# Patient Record
Sex: Female | Born: 1970 | Race: White | Hispanic: No | Marital: Single | Smoking: Former smoker
Health system: Southern US, Community
[De-identification: ages and names within clinical notes are randomized; demographics above are authoritative.]

## PROBLEM LIST (undated history)

## (undated) DIAGNOSIS — M1990 Unspecified osteoarthritis, unspecified site: Secondary | ICD-10-CM

## (undated) DIAGNOSIS — F419 Anxiety disorder, unspecified: Secondary | ICD-10-CM

## (undated) DIAGNOSIS — E282 Polycystic ovarian syndrome: Secondary | ICD-10-CM

## (undated) DIAGNOSIS — R51 Headache: Secondary | ICD-10-CM

## (undated) DIAGNOSIS — R519 Headache, unspecified: Secondary | ICD-10-CM

## (undated) DIAGNOSIS — F32A Depression, unspecified: Secondary | ICD-10-CM

## (undated) DIAGNOSIS — Z8709 Personal history of other diseases of the respiratory system: Secondary | ICD-10-CM

## (undated) DIAGNOSIS — J45909 Unspecified asthma, uncomplicated: Secondary | ICD-10-CM

## (undated) DIAGNOSIS — F329 Major depressive disorder, single episode, unspecified: Secondary | ICD-10-CM

## (undated) DIAGNOSIS — J189 Pneumonia, unspecified organism: Secondary | ICD-10-CM

## (undated) DIAGNOSIS — E079 Disorder of thyroid, unspecified: Secondary | ICD-10-CM

## (undated) DIAGNOSIS — G2581 Restless legs syndrome: Secondary | ICD-10-CM

## (undated) DIAGNOSIS — E039 Hypothyroidism, unspecified: Secondary | ICD-10-CM

## (undated) DIAGNOSIS — Z8701 Personal history of pneumonia (recurrent): Secondary | ICD-10-CM

## (undated) HISTORY — PX: TONSILLECTOMY: SUR1361

## (undated) HISTORY — DX: Pneumonia, unspecified organism: J18.9

## (undated) HISTORY — DX: Restless legs syndrome: G25.81

## (undated) HISTORY — PX: CHOLECYSTECTOMY: SHX55

---

## 2014-10-02 ENCOUNTER — Emergency Department (HOSPITAL_COMMUNITY)
Admission: EM | Admit: 2014-10-02 | Discharge: 2014-10-02 | Disposition: A | Discharge status: Home / Self Care | Payer: 59 | Attending: Emergency Medicine | Admitting: Emergency Medicine

## 2014-10-02 ENCOUNTER — Emergency Department (HOSPITAL_COMMUNITY): Payer: 59

## 2014-10-02 ENCOUNTER — Encounter (HOSPITAL_COMMUNITY): Payer: Self-pay | Admitting: Emergency Medicine

## 2014-10-02 DIAGNOSIS — R1310 Dysphagia, unspecified: Secondary | ICD-10-CM | POA: Diagnosis not present

## 2014-10-02 DIAGNOSIS — J45909 Unspecified asthma, uncomplicated: Secondary | ICD-10-CM | POA: Diagnosis not present

## 2014-10-02 DIAGNOSIS — E669 Obesity, unspecified: Secondary | ICD-10-CM | POA: Diagnosis not present

## 2014-10-02 DIAGNOSIS — J988 Other specified respiratory disorders: Secondary | ICD-10-CM | POA: Diagnosis present

## 2014-10-02 DIAGNOSIS — Z79899 Other long term (current) drug therapy: Secondary | ICD-10-CM | POA: Diagnosis not present

## 2014-10-02 DIAGNOSIS — E079 Disorder of thyroid, unspecified: Secondary | ICD-10-CM | POA: Insufficient documentation

## 2014-10-02 HISTORY — DX: Disorder of thyroid, unspecified: E07.9

## 2014-10-02 HISTORY — DX: Polycystic ovarian syndrome: E28.2

## 2014-10-02 HISTORY — DX: Major depressive disorder, single episode, unspecified: F32.9

## 2014-10-02 HISTORY — DX: Depression, unspecified: F32.A

## 2014-10-02 HISTORY — DX: Unspecified asthma, uncomplicated: J45.909

## 2014-10-02 NOTE — Discharge Instructions (Signed)
Follow-up ENT doctor. Call office in the morning for appointment Thursday or Friday.

## 2014-10-02 NOTE — ED Notes (Signed)
Pt states it feels like something is stuck in her throat. She is able to breath, talk and swallow normal. Pt states she has felt like this x 2 days.

## 2014-10-02 NOTE — ED Provider Notes (Signed)
CSN: [id number]     Arrival date & time 10/02/14  1906 History  This chart was scribed for Nat Christen, MD by Julien Nordmann, ED Scribe. This patient was seen in room APA14/APA14 and the patient's care was started at 9:43 PM.    Chief Complaint  Patient presents with  . Airway Obstruction      The history is provided by the patient. No language interpreter was used.   HPI Comments: Connie Simpson is a 44 y.o. female who presents to the Emergency Department complaining of airway obstruction onset 3 days ago. Pt states she feels like something is stuck in her throat. Pt has been eating normal. Per mother, pt has severe coughing last week. She reports being discharged from the hospital on 7/1 for pneumonia. Pt is unsure of what caused that feeling in her throat. Pt denies loss of appetite, trouble swallowing, and difficulty breathing.  Past Medical History  Diagnosis Date  . Thyroid disease   . Asthma   . Depression   . PCOS (polycystic ovarian syndrome)    Past Surgical History  Procedure Laterality Date  . Tonsillectomy    . Cesarean section     History reviewed. No pertinent family history. History  Substance Use Topics  . Smoking status: Never Smoker   . Smokeless tobacco: Not on file  . Alcohol Use: No   OB History    No data available     Review of Systems  A complete 10 system review of systems was obtained and all systems are negative except as noted in the HPI and PMH.    Allergies  Erythromycin  Home Medications   Prior to Admission medications   Medication Sig Start Date End Date Taking? Authorizing Provider  albuterol (PROVENTIL) (2.5 MG/3ML) 0.083% nebulizer solution Take 2.5 mg by nebulization every 6 (six) hours as needed for wheezing.  09/24/14  Yes Historical Provider, MD  ALPRAZolam Duanne Moron) 0.5 MG tablet Take 0.5 mg by mouth 2 (two) times daily as needed for anxiety.  08/19/14  Yes Historical Provider, MD  cyclobenzaprine (FLEXERIL) 10 MG tablet Take 10  mg by mouth at bedtime. 07/19/14  Yes Historical Provider, MD  DULERA 200-5 MCG/ACT AERO Inhale 2 puffs into the lungs 2 (two) times daily. 09/24/14  Yes Historical Provider, MD  levothyroxine (SYNTHROID, LEVOTHROID) 100 MCG tablet Take 100 mcg by mouth daily. 09/16/14  Yes Historical Provider, MD  metFORMIN (GLUCOPHAGE-XR) 500 MG 24 hr tablet Take 500 mg by mouth 2 (two) times daily. 07/19/14  Yes Historical Provider, MD  montelukast (SINGULAIR) 10 MG tablet Take 10 mg by mouth every morning. 07/19/14  Yes Historical Provider, MD  pantoprazole (PROTONIX) 20 MG tablet Take 20 mg by mouth 2 (two) times daily. 07/19/14  Yes Historical Provider, MD  sertraline (ZOLOFT) 100 MG tablet Take 100 mg by mouth daily. 07/19/14  Yes Historical Provider, MD  VENTOLIN HFA 108 (90 BASE) MCG/ACT inhaler INHALE TWO PUFFS EVERY 4 TO 6 HOURS AS NEEDED FOR COUGH OR WHEEZE 09/16/14  Yes Historical Provider, MD  azithromycin (ZITHROMAX) 250 MG tablet TAKE TWO (2) TABLETS ON FIRST DAY, THEN TAKE ONE (1) TABLET EACH DAY FOR THE NEXT 4 DAYS 09/23/14   Historical Provider, MD  levofloxacin (LEVAQUIN) 500 MG tablet Take 500 mg by mouth daily. 5 day course completed on 10/01/14 09/27/14   Historical Provider, MD  predniSONE (DELTASONE) 20 MG tablet TAKE TWO (2) TABLETS BY MOUTH DAILY FOR 5 DAYS. 09/27/14   Historical Provider, MD  Triage vitals: BP 164/93 mmHg  Pulse 60  Temp(Src) 98.3 F (36.8 C) (Oral)  Resp 17  Ht 5' (1.524 m)  Wt 355 lb (161.027 kg)  BMI 69.33 kg/m2  SpO2 98%  LMP 09/24/2014  Physical Exam  Constitutional: She is oriented to person, place, and time.  obese  HENT:  Head: Normocephalic and atraumatic.  Eyes: Conjunctivae and EOM are normal. Pupils are equal, round, and reactive to light.  Neck: Normal range of motion. Neck supple.  Cardiovascular: Normal rate and regular rhythm.   Pulmonary/Chest: Effort normal and breath sounds normal.  Abdominal: Soft. Bowel sounds are normal.  Musculoskeletal: Normal  range of motion.  Neurological: She is alert and oriented to person, place, and time.  Skin: Skin is warm and dry.  Psychiatric: She has a normal mood and affect. Her behavior is normal.  Nursing note and vitals reviewed.   ED Course  Procedures  DIAGNOSTIC STUDIES: Oxygen Saturation is 98% on RA, normal by my interpretation.  COORDINATION OF CARE:  9:46 PM Discussed treatment plan which includes follow up with ENT doctor with pt at bedside and pt agreed to plan.  Labs Review Labs Reviewed - No data to display  Imaging Review Dg Chest 2 View  10/02/2014   CLINICAL DATA:  Chest discomfort and sore throat. Sensation of something being stuck in throat. Asthma.  EXAM: CHEST  2 VIEW  COMPARISON:  None.  FINDINGS: The heart size and mediastinal contours are within normal limits. Both lungs are clear. No evidence of pleural effusion or pneumothorax. Mild pulmonary hyperinflation demonstrated. Mild lower thoracic spine degenerative disc disease and kyphosis noted. No radiopaque foreign body identified.  IMPRESSION: No active cardiopulmonary disease.   Electronically Signed   By: Earle Gell M.D.   On: 10/02/2014 19:56     EKG Interpretation None      MDM   Final diagnoses:  Dysphagia   Patient is able to swallow. No respiratory distress. Chest x-ray negative. Discussed with Dr. Erik Obey.  Patient will follow-up as an outpatient in the office.  I personally performed the services described in this documentation, which was scribed in my presence. The recorded information has been reviewed and is accurate.   Nat Christen, MD 10/02/14 [phone]

## 2015-05-01 ENCOUNTER — Ambulatory Visit (INDEPENDENT_AMBULATORY_CARE_PROVIDER_SITE_OTHER): Payer: 59 | Admitting: Otolaryngology

## 2015-05-01 DIAGNOSIS — J330 Polyp of nasal cavity: Secondary | ICD-10-CM

## 2015-05-01 DIAGNOSIS — J310 Chronic rhinitis: Secondary | ICD-10-CM | POA: Diagnosis not present

## 2015-05-01 DIAGNOSIS — J343 Hypertrophy of nasal turbinates: Secondary | ICD-10-CM | POA: Diagnosis not present

## 2015-05-01 DIAGNOSIS — J342 Deviated nasal septum: Secondary | ICD-10-CM

## 2015-05-02 ENCOUNTER — Other Ambulatory Visit (INDEPENDENT_AMBULATORY_CARE_PROVIDER_SITE_OTHER): Payer: Self-pay | Admitting: Otolaryngology

## 2015-05-02 DIAGNOSIS — J320 Chronic maxillary sinusitis: Secondary | ICD-10-CM

## 2015-05-06 ENCOUNTER — Ambulatory Visit (HOSPITAL_COMMUNITY)
Admission: RE | Admit: 2015-05-06 | Discharge: 2015-05-06 | Disposition: A | Discharge status: Home / Self Care | Payer: 59 | Source: Ambulatory Visit | Attending: Otolaryngology | Admitting: Otolaryngology

## 2015-05-06 DIAGNOSIS — R51 Headache: Secondary | ICD-10-CM | POA: Diagnosis not present

## 2015-05-06 DIAGNOSIS — J320 Chronic maxillary sinusitis: Secondary | ICD-10-CM | POA: Diagnosis not present

## 2015-05-06 DIAGNOSIS — J323 Chronic sphenoidal sinusitis: Secondary | ICD-10-CM | POA: Insufficient documentation

## 2015-05-08 ENCOUNTER — Ambulatory Visit (INDEPENDENT_AMBULATORY_CARE_PROVIDER_SITE_OTHER): Payer: 59 | Admitting: Otolaryngology

## 2015-05-08 DIAGNOSIS — J323 Chronic sphenoidal sinusitis: Secondary | ICD-10-CM | POA: Diagnosis not present

## 2015-05-08 DIAGNOSIS — J320 Chronic maxillary sinusitis: Secondary | ICD-10-CM

## 2015-05-08 DIAGNOSIS — J322 Chronic ethmoidal sinusitis: Secondary | ICD-10-CM

## 2015-05-08 DIAGNOSIS — J330 Polyp of nasal cavity: Secondary | ICD-10-CM

## 2015-05-13 ENCOUNTER — Other Ambulatory Visit: Payer: Self-pay | Admitting: Otolaryngology

## 2015-08-15 ENCOUNTER — Other Ambulatory Visit: Payer: Self-pay | Admitting: Otolaryngology

## 2015-08-18 NOTE — Pre-Procedure Instructions (Signed)
Connie Simpson  08/18/2015      [facility] - [place], [place], [place] - [place] [address] Phone: [phone] Fax: [phone]    Your procedure is scheduled on Aug 27, 2015  Report to [facility] Admitting at 06:30 A.M.  Call this number if you have problems the morning of surgery:  [phone]   Remember:  Do not eat food or drink liquids after midnight.   Take these medicines the morning of surgery with A SIP OF WATER  Tylenol if needed, Advair, albuterol if needed, alprazolam (Xanax), zyrtec, levothyroxine (Synthroid), Montelukast (Singulair), sertaline (zoloft), ventolin inhaler if needed  Please bring all inhalers with you on day of surgery  WHAT DO I DO ABOUT MY DIABETES MEDICATION?  Do not take Metformin diabetes medicines (pills) the morning of surgery.  Stop taking Aspirin, ibuprofen, aleve, naproxen, Advil, Motrin, Goody's, BC's, all herbal medications, all vitamins, and fish oil    How to Manage Your Diabetes Before and After Surgery  Why is it important to control my blood sugar before and after surgery? . Improving blood sugar levels before and after surgery helps healing and can limit problems. . A way of improving blood sugar control is eating a healthy diet by: o  Eating less sugar and carbohydrates o  Increasing activity/exercise o  Talking with your doctor about reaching your blood sugar goals . High blood sugars (greater than 180 mg/dL) can raise your risk of infections and slow your recovery, so you will need to focus on controlling your diabetes during the weeks before surgery. . Make sure that the doctor who takes care of your diabetes knows about your planned surgery including the date and location.  How do I manage my blood sugar before surgery? . Check your blood sugar at least 4 times a day, starting 2 days before surgery, to make sure that the level is not too high or low. o Check your blood  sugar the morning of your surgery when you wake up and every 2 hours until you get to the Short Stay unit. . If your blood sugar is less than 70 mg/dL, you will need to treat for low blood sugar: o Do not take insulin. o Treat a low blood sugar (less than 70 mg/dL) with  cup of clear juice (cranberry or apple), 4 glucose tablets, OR glucose gel. o Recheck blood sugar in 15 minutes after treatment (to make sure it is greater than 70 mg/dL). If your blood sugar is not greater than 70 mg/dL on recheck, call [phone] for further instructions. . Report your blood sugar to the short stay nurse when you get to Short Stay.  . If you are admitted to the hospital after surgery: o Your blood sugar will be checked by the staff and you will probably be given insulin after surgery (instead of oral diabetes medicines) to make sure you have good blood sugar levels. o The goal for blood sugar control after surgery is 80-180 mg/dL.  .    Do not wear jewelry, make-up or nail polish.  Do not wear lotions, powders, or perfumes.  You may wear deodorant.  Do not shave 48 hours prior to surgery.  Men may shave face and neck.  Do not bring valuables to the hospital.  [facility] is not responsible for any belongings or valuables.  Contacts, dentures or bridgework may not be worn into surgery.  Leave your suitcase in  the car.  After surgery it may be brought to your room.  For patients admitted to the hospital, discharge time will be determined by your treatment team.  Patients discharged the day of surgery will not be allowed to drive home.   Special instructions:  [place] - Preparing for Surgery  Before surgery, you can play an important role.  Because skin is not sterile, your skin needs to be as free of germs as possible.  You can reduce the number of germs on you skin by washing with CHG (chlorahexidine gluconate) soap before surgery.  CHG is an antiseptic cleaner which kills germs and bonds with the  skin to continue killing germs even after washing.  Please DO NOT use if you have an allergy to CHG or antibacterial soaps.  If your skin becomes reddened/irritated stop using the CHG and inform your nurse when you arrive at Short Stay.  Do not shave (including legs and underarms) for at least 48 hours prior to the first CHG shower.  You may shave your face.  Please follow these instructions carefully:   1.  Shower with CHG Soap the night before surgery and the  morning of Surgery.  2.  If you choose to wash your hair, wash your hair first as usual with your normal shampoo.  3.  After you shampoo, rinse your hair and body thoroughly to remove the Shampoo.  4.  Use CHG as you would any other liquid soap.  You can apply chg directly to the skin and wash gently with scrungie or a clean washcloth.  5.  Apply the CHG Soap to your body ONLY FROM THE NECK DOWN.  Do not use on open wounds or open sores.  Avoid contact with your eyes ears, mouth and genitals (private parts).  Wash genitals (private parts) with your normal soap.  6.  Wash thoroughly, paying special attention to the area where your surgery will be performed.  7.  Thoroughly rinse your body with warm water from the neck down.  8.  DO NOT shower/wash with your normal soap after using and rinsing off the CHG Soap.  9.  Pat yourself dry with a clean towel.            10.  Wear clean pajamas.            11.  Place clean sheets on your bed the night of your first shower and do not sleep with pets.  Day of Surgery  Do not apply any lotions/deoderants the morning of surgery.  Please wear clean clothes to the hospital/surgery center.    Please read over the following fact sheets that you were given. Pain Booklet, Coughing and Deep Breathing and Surgical Site Infection Prevention    .

## 2015-08-19 ENCOUNTER — Encounter (HOSPITAL_COMMUNITY)
Admission: RE | Admit: 2015-08-19 | Discharge: 2015-08-19 | Disposition: A | Discharge status: Home / Self Care | Payer: 59 | Source: Ambulatory Visit | Attending: Otolaryngology | Admitting: Otolaryngology

## 2015-08-19 ENCOUNTER — Encounter (HOSPITAL_COMMUNITY): Payer: Self-pay

## 2015-08-19 DIAGNOSIS — J45909 Unspecified asthma, uncomplicated: Secondary | ICD-10-CM | POA: Diagnosis not present

## 2015-08-19 DIAGNOSIS — J343 Hypertrophy of nasal turbinates: Secondary | ICD-10-CM | POA: Diagnosis not present

## 2015-08-19 DIAGNOSIS — J342 Deviated nasal septum: Secondary | ICD-10-CM | POA: Insufficient documentation

## 2015-08-19 DIAGNOSIS — E039 Hypothyroidism, unspecified: Secondary | ICD-10-CM | POA: Diagnosis not present

## 2015-08-19 DIAGNOSIS — J328 Other chronic sinusitis: Secondary | ICD-10-CM | POA: Diagnosis not present

## 2015-08-19 DIAGNOSIS — Z7984 Long term (current) use of oral hypoglycemic drugs: Secondary | ICD-10-CM | POA: Insufficient documentation

## 2015-08-19 DIAGNOSIS — E282 Polycystic ovarian syndrome: Secondary | ICD-10-CM | POA: Insufficient documentation

## 2015-08-19 DIAGNOSIS — Z01818 Encounter for other preprocedural examination: Secondary | ICD-10-CM | POA: Insufficient documentation

## 2015-08-19 DIAGNOSIS — Z01812 Encounter for preprocedural laboratory examination: Secondary | ICD-10-CM | POA: Insufficient documentation

## 2015-08-19 DIAGNOSIS — Z79899 Other long term (current) drug therapy: Secondary | ICD-10-CM | POA: Insufficient documentation

## 2015-08-19 HISTORY — DX: Personal history of pneumonia (recurrent): Z87.01

## 2015-08-19 HISTORY — DX: Headache, unspecified: R51.9

## 2015-08-19 HISTORY — DX: Unspecified osteoarthritis, unspecified site: M19.90

## 2015-08-19 HISTORY — DX: Hypothyroidism, unspecified: E03.9

## 2015-08-19 HISTORY — DX: Headache: R51

## 2015-08-19 HISTORY — DX: Personal history of other diseases of the respiratory system: Z87.09

## 2015-08-19 HISTORY — DX: Anxiety disorder, unspecified: F41.9

## 2015-08-19 LAB — CBC
HEMATOCRIT: 43.1 % (ref 36.0–46.0)
HEMOGLOBIN: 13.2 g/dL (ref 12.0–15.0)
MCH: 25.8 pg — ABNORMAL LOW (ref 26.0–34.0)
MCHC: 30.6 g/dL (ref 30.0–36.0)
MCV: 84.3 fL (ref 78.0–100.0)
Platelets: 296 10*3/uL (ref 150–400)
RBC: 5.11 MIL/uL (ref 3.87–5.11)
RDW: 13.8 % (ref 11.5–15.5)
WBC: 8.4 10*3/uL (ref 4.0–10.5)

## 2015-08-19 LAB — HCG, SERUM, QUALITATIVE: Preg, Serum: NEGATIVE

## 2015-08-19 NOTE — Progress Notes (Signed)
PCP- Rory Percy, MD Cardiology - denies  EKG: requested from [facility] test - denies ECHO - denies Cardiac Cath - denies  Patient denies shortness of breath and chest pain at PAT appointment  Patient takes Metformin for PCOS

## 2015-08-20 NOTE — Progress Notes (Signed)
Anesthesia Chart Review:  Pt is a 45 year old female scheduled for sinus endo with fusion, endoscopic total B ethmoidectomy, endoscopic B maxillary antrostomy, endoscopic L sphenoidectomy, nasal septoplasty with turbinate reduction on 08/27/2015 with Dr. Benjamine Mola.   PMH includes:  Asthma, hypothyroidism, PCOS. Never smoker. BMI 59  Medications include: advair, albuterol, levothyroxine, metformin  Preoperative labs reviewed.    Chest x-ray 10/02/14 reviewed. No active cardiopulmonary disease.   EKG 01/25/15: sinus rhythm. Low voltage precordial leads. Consider anterior infarct. Baseline wander in V1, V2.   Reviewed case with Dr. Royce Macadamia.   If no changes, I anticipate pt can proceed with surgery as scheduled.   Willeen Cass, FNP-BC [facility]/Anesthesiology Phone: [phone] 08/20/2015 3:34 PM

## 2015-08-26 ENCOUNTER — Encounter (HOSPITAL_BASED_OUTPATIENT_CLINIC_OR_DEPARTMENT_OTHER): Payer: Self-pay | Admitting: Certified Registered Nurse Anesthetist

## 2015-08-26 DIAGNOSIS — F419 Anxiety disorder, unspecified: Secondary | ICD-10-CM | POA: Diagnosis present

## 2015-08-26 DIAGNOSIS — J342 Deviated nasal septum: Secondary | ICD-10-CM | POA: Diagnosis present

## 2015-08-26 DIAGNOSIS — I10 Essential (primary) hypertension: Secondary | ICD-10-CM | POA: Diagnosis present

## 2015-08-26 DIAGNOSIS — K219 Gastro-esophageal reflux disease without esophagitis: Secondary | ICD-10-CM | POA: Diagnosis present

## 2015-08-26 DIAGNOSIS — J343 Hypertrophy of nasal turbinates: Secondary | ICD-10-CM | POA: Diagnosis present

## 2015-08-26 DIAGNOSIS — M1990 Unspecified osteoarthritis, unspecified site: Secondary | ICD-10-CM | POA: Diagnosis present

## 2015-08-26 DIAGNOSIS — F329 Major depressive disorder, single episode, unspecified: Secondary | ICD-10-CM | POA: Diagnosis present

## 2015-08-26 DIAGNOSIS — F32A Depression, unspecified: Secondary | ICD-10-CM | POA: Diagnosis present

## 2015-08-26 DIAGNOSIS — J45909 Unspecified asthma, uncomplicated: Secondary | ICD-10-CM | POA: Diagnosis present

## 2015-08-26 NOTE — Anesthesia Preprocedure Evaluation (Addendum)
Anesthesia Evaluation   Patient awake    Reviewed: Allergy & Precautions, H&P , NPO status , Patient's Chart, lab work & pertinent test results  History of Anesthesia Complications Negative for: history of anesthetic complications  Airway Mallampati: III  TM Distance: >3 FB Neck ROM: Full    Dental  (+) Teeth Intact, Dental Advisory Given   Pulmonary shortness of breath and with exertion, asthma ,    breath sounds clear to auscultation       Cardiovascular Exercise Tolerance: Poor  Rhythm:Regular Rate:Normal     Neuro/Psych  Headaches, PSYCHIATRIC DISORDERS Anxiety Depression    GI/Hepatic GERD  Medicated and Controlled,  Endo/Other  diabetes, Oral Hypoglycemic AgentsHypothyroidism Morbid obesity  Renal/GU      Musculoskeletal  (+) Arthritis , Osteoarthritis,    Abdominal   Peds  Hematology   Anesthesia Other Findings   Reproductive/Obstetrics                            BP Readings from Last 3 Encounters:  08/19/15 157/89  10/02/14 136/94   Lab Results  Component Value Date   WBC 8.4 08/19/2015   HGB 13.2 08/19/2015   HCT 43.1 08/19/2015   MCV 84.3 08/19/2015   PLT 296 08/19/2015     Chemistry   No results found for: NA, K, CL, CO2, BUN, CREATININE, GLU No results found for: CALCIUM, ALKPHOS, AST, ALT, BILITOT   No results found for: HGBA1C  Anesthesia Physical Anesthesia Plan  ASA: III  Anesthesia Plan: General   Post-op Pain Management:    Induction: Intravenous  Airway Management Planned: Oral ETT  Additional Equipment:   Intra-op Plan:   Post-operative Plan: Extubation in OR  Informed Consent: I have reviewed the patients History and Physical, chart, labs and discussed the procedure including the risks, benefits and alternatives for the proposed anesthesia with the patient or authorized representative who has indicated his/her understanding and acceptance.    Dental advisory given  Plan Discussed with: CRNA  Anesthesia Plan Comments:         Anesthesia Quick Evaluation

## 2015-08-27 ENCOUNTER — Ambulatory Visit (HOSPITAL_COMMUNITY): Payer: 59 | Admitting: Emergency Medicine

## 2015-08-27 ENCOUNTER — Ambulatory Visit (HOSPITAL_BASED_OUTPATIENT_CLINIC_OR_DEPARTMENT_OTHER)
Admission: RE | Admit: 2015-08-27 | Discharge: 2015-08-27 | Disposition: A | Discharge status: Home / Self Care | Payer: 59 | Source: Ambulatory Visit | Attending: Otolaryngology | Admitting: Otolaryngology

## 2015-08-27 ENCOUNTER — Encounter (HOSPITAL_COMMUNITY)
Admission: RE | Disposition: A | Discharge status: Home / Self Care | Payer: Self-pay | Source: Ambulatory Visit | Attending: Otolaryngology

## 2015-08-27 ENCOUNTER — Ambulatory Visit (HOSPITAL_COMMUNITY): Payer: 59 | Admitting: Certified Registered Nurse Anesthetist

## 2015-08-27 DIAGNOSIS — E119 Type 2 diabetes mellitus without complications: Secondary | ICD-10-CM | POA: Insufficient documentation

## 2015-08-27 DIAGNOSIS — E039 Hypothyroidism, unspecified: Secondary | ICD-10-CM | POA: Insufficient documentation

## 2015-08-27 DIAGNOSIS — Z6841 Body Mass Index (BMI) 40.0 and over, adult: Secondary | ICD-10-CM | POA: Insufficient documentation

## 2015-08-27 DIAGNOSIS — J320 Chronic maxillary sinusitis: Secondary | ICD-10-CM | POA: Insufficient documentation

## 2015-08-27 DIAGNOSIS — J343 Hypertrophy of nasal turbinates: Secondary | ICD-10-CM | POA: Diagnosis present

## 2015-08-27 DIAGNOSIS — K219 Gastro-esophageal reflux disease without esophagitis: Secondary | ICD-10-CM | POA: Diagnosis not present

## 2015-08-27 DIAGNOSIS — J3489 Other specified disorders of nose and nasal sinuses: Secondary | ICD-10-CM | POA: Insufficient documentation

## 2015-08-27 DIAGNOSIS — F419 Anxiety disorder, unspecified: Secondary | ICD-10-CM | POA: Diagnosis not present

## 2015-08-27 DIAGNOSIS — Z7984 Long term (current) use of oral hypoglycemic drugs: Secondary | ICD-10-CM | POA: Insufficient documentation

## 2015-08-27 DIAGNOSIS — J342 Deviated nasal septum: Secondary | ICD-10-CM | POA: Diagnosis present

## 2015-08-27 DIAGNOSIS — J338 Other polyp of sinus: Secondary | ICD-10-CM | POA: Insufficient documentation

## 2015-08-27 DIAGNOSIS — F329 Major depressive disorder, single episode, unspecified: Secondary | ICD-10-CM | POA: Insufficient documentation

## 2015-08-27 DIAGNOSIS — J45909 Unspecified asthma, uncomplicated: Secondary | ICD-10-CM | POA: Diagnosis not present

## 2015-08-27 DIAGNOSIS — M1990 Unspecified osteoarthritis, unspecified site: Secondary | ICD-10-CM | POA: Diagnosis present

## 2015-08-27 DIAGNOSIS — J322 Chronic ethmoidal sinusitis: Secondary | ICD-10-CM | POA: Insufficient documentation

## 2015-08-27 DIAGNOSIS — F32A Depression, unspecified: Secondary | ICD-10-CM | POA: Diagnosis present

## 2015-08-27 DIAGNOSIS — J323 Chronic sphenoidal sinusitis: Secondary | ICD-10-CM | POA: Diagnosis not present

## 2015-08-27 DIAGNOSIS — I10 Essential (primary) hypertension: Secondary | ICD-10-CM | POA: Diagnosis present

## 2015-08-27 HISTORY — PX: NASAL SEPTOPLASTY W/ TURBINOPLASTY: SHX2070

## 2015-08-27 HISTORY — PX: SINUS ENDO WITH FUSION: SHX5329

## 2015-08-27 HISTORY — PX: MAXILLARY ANTROSTOMY: SHX2003

## 2015-08-27 HISTORY — PX: SPHENOIDECTOMY: SHX2421

## 2015-08-27 HISTORY — PX: ETHMOIDECTOMY: SHX5197

## 2015-08-27 SURGERY — SURGERY, PARANASAL SINUS, ENDOSCOPIC, WITH NASAL SEPTOPLASTY, TURBINOPLASTY, AND MAXILLARY SINUSOTOMY
Anesthesia: General | Site: Nose

## 2015-08-27 MED ORDER — KETAMINE HCL 100 MG/ML IJ SOLN
INTRAMUSCULAR | Status: DC | PRN
  Administered 2015-08-27: 40 mg via INTRAVENOUS
  Administered 2015-08-27: 20 mg via INTRAVENOUS
  Administered 2015-08-27: 80 mg via INTRAVENOUS
  Administered 2015-08-27: 20 mg via INTRAVENOUS

## 2015-08-27 MED ORDER — ROCURONIUM BROMIDE 50 MG/5ML IV SOLN
INTRAVENOUS | Status: AC
  Filled 2015-08-27: qty 1

## 2015-08-27 MED ORDER — DEXAMETHASONE SODIUM PHOSPHATE 10 MG/ML IJ SOLN
INTRAMUSCULAR | Status: DC | PRN
  Administered 2015-08-27: 10 mg via INTRAVENOUS

## 2015-08-27 MED ORDER — PROPOFOL 10 MG/ML IV BOLUS
INTRAVENOUS | Status: DC | PRN
  Administered 2015-08-27: 50 mg via INTRAVENOUS
  Administered 2015-08-27: 300 mg via INTRAVENOUS

## 2015-08-27 MED ORDER — BACITRACIN-NEOMYCIN-POLYMYXIN 400-5-5000 EX OINT
TOPICAL_OINTMENT | CUTANEOUS | Status: AC
  Filled 2015-08-27: qty 1

## 2015-08-27 MED ORDER — DEXMEDETOMIDINE HCL IN NACL 200 MCG/50ML IV SOLN
INTRAVENOUS | Status: DC | PRN
  Administered 2015-08-27: .5 ug/kg/h via INTRAVENOUS

## 2015-08-27 MED ORDER — PHENYLEPHRINE 40 MCG/ML (10ML) SYRINGE FOR IV PUSH (FOR BLOOD PRESSURE SUPPORT)
PREFILLED_SYRINGE | INTRAVENOUS | Status: AC
  Filled 2015-08-27: qty 10

## 2015-08-27 MED ORDER — OXYCODONE-ACETAMINOPHEN 5-325 MG PO TABS: 1.0000 | ORAL_TABLET | ORAL | Status: DC | PRN

## 2015-08-27 MED ORDER — LIDOCAINE-EPINEPHRINE 1 %-1:100000 IJ SOLN
INTRAMUSCULAR | Status: DC | PRN
  Administered 2015-08-27: 3 mL

## 2015-08-27 MED ORDER — BACITRACIN ZINC 500 UNIT/GM EX OINT
TOPICAL_OINTMENT | CUTANEOUS | Status: DC | PRN
  Administered 2015-08-27: 1 via TOPICAL

## 2015-08-27 MED ORDER — ROCURONIUM BROMIDE 100 MG/10ML IV SOLN
INTRAVENOUS | Status: DC | PRN
  Administered 2015-08-27 (×3): 50 mg via INTRAVENOUS

## 2015-08-27 MED ORDER — SODIUM CHLORIDE 0.9 % IR SOLN
Status: DC | PRN
  Administered 2015-08-27: 1000 mL

## 2015-08-27 MED ORDER — LIDOCAINE 2% (20 MG/ML) 5 ML SYRINGE
INTRAMUSCULAR | Status: AC
  Filled 2015-08-27: qty 5

## 2015-08-27 MED ORDER — BACITRACIN ZINC 500 UNIT/GM EX OINT
TOPICAL_OINTMENT | CUTANEOUS | Status: AC
  Filled 2015-08-27: qty 28.35

## 2015-08-27 MED ORDER — FENTANYL CITRATE (PF) 100 MCG/2ML IJ SOLN
INTRAMUSCULAR | Status: DC | PRN
  Administered 2015-08-27: 100 ug via INTRAVENOUS

## 2015-08-27 MED ORDER — CEFAZOLIN SODIUM 1 G IJ SOLR
INTRAMUSCULAR | Status: DC | PRN
  Administered 2015-08-27: 3 g via INTRAMUSCULAR

## 2015-08-27 MED ORDER — DEXMEDETOMIDINE HCL IN NACL 200 MCG/50ML IV SOLN
INTRAVENOUS | Status: AC
  Filled 2015-08-27: qty 50

## 2015-08-27 MED ORDER — KETAMINE HCL 100 MG/ML IJ SOLN
INTRAMUSCULAR | Status: AC
  Filled 2015-08-27: qty 1

## 2015-08-27 MED ORDER — OXYMETAZOLINE HCL 0.05 % NA SOLN
NASAL | Status: DC | PRN
  Administered 2015-08-27: 1 via TOPICAL

## 2015-08-27 MED ORDER — AMOXICILLIN 875 MG PO TABS: 875.0000 mg | ORAL_TABLET | Freq: Two times a day (BID) | ORAL | Status: DC

## 2015-08-27 MED ORDER — LACTATED RINGERS IV SOLN
INTRAVENOUS | Status: DC | PRN
  Administered 2015-08-27 (×2): via INTRAVENOUS

## 2015-08-27 MED ORDER — SUCCINYLCHOLINE CHLORIDE 20 MG/ML IJ SOLN
INTRAMUSCULAR | Status: DC | PRN
  Administered 2015-08-27: 140 mg via INTRAVENOUS

## 2015-08-27 MED ORDER — PROPOFOL 10 MG/ML IV BOLUS
INTRAVENOUS | Status: AC
  Filled 2015-08-27: qty 40

## 2015-08-27 MED ORDER — ESMOLOL HCL 100 MG/10ML IV SOLN
INTRAVENOUS | Status: DC | PRN
  Administered 2015-08-27: 20 mg via INTRAVENOUS
  Administered 2015-08-27: 10 mg via INTRAVENOUS

## 2015-08-27 MED ORDER — FENTANYL CITRATE (PF) 250 MCG/5ML IJ SOLN
INTRAMUSCULAR | Status: AC
  Filled 2015-08-27: qty 5

## 2015-08-27 MED ORDER — LIDOCAINE HCL (CARDIAC) 20 MG/ML IV SOLN
INTRAVENOUS | Status: DC | PRN
  Administered 2015-08-27: 80 mg via INTRAVENOUS

## 2015-08-27 MED ORDER — 0.9 % SODIUM CHLORIDE (POUR BTL) OPTIME
TOPICAL | Status: DC | PRN
  Administered 2015-08-27: 1000 mL

## 2015-08-27 MED ORDER — LIDOCAINE HCL 4 % MT SOLN
OROMUCOSAL | Status: DC | PRN
  Administered 2015-08-27: 3 mL via TOPICAL

## 2015-08-27 MED ORDER — CEFAZOLIN SODIUM 1 G IJ SOLR
INTRAMUSCULAR | Status: AC
  Filled 2015-08-27: qty 30

## 2015-08-27 MED ORDER — PHENYLEPHRINE HCL 10 MG/ML IJ SOLN
10.0000 mg | INTRAMUSCULAR | Status: DC | PRN
  Administered 2015-08-27: 20 ug/min via INTRAVENOUS

## 2015-08-27 MED ORDER — ACETAMINOPHEN 10 MG/ML IV SOLN
1000.0000 mg | INTRAVENOUS | Status: AC
  Administered 2015-08-27: 1000 mg via INTRAVENOUS
  Filled 2015-08-27: qty 100

## 2015-08-27 MED ORDER — HYDROMORPHONE HCL 1 MG/ML IJ SOLN
0.2500 mg | INTRAMUSCULAR | Status: DC | PRN
  Administered 2015-08-27: 0.25 mg via INTRAVENOUS

## 2015-08-27 MED ORDER — MIDAZOLAM HCL 5 MG/5ML IJ SOLN
INTRAMUSCULAR | Status: DC | PRN
  Administered 2015-08-27: 1 mg via INTRAVENOUS

## 2015-08-27 MED ORDER — EPHEDRINE SULFATE 50 MG/ML IJ SOLN
INTRAMUSCULAR | Status: DC | PRN
  Administered 2015-08-27: 10 mg via INTRAVENOUS
  Administered 2015-08-27: 15 mg via INTRAVENOUS
  Administered 2015-08-27: 10 mg via INTRAVENOUS

## 2015-08-27 MED ORDER — ONDANSETRON HCL 4 MG/2ML IJ SOLN
INTRAMUSCULAR | Status: DC | PRN
  Administered 2015-08-27: 4 mg via INTRAVENOUS

## 2015-08-27 MED ORDER — MIDAZOLAM HCL 2 MG/2ML IJ SOLN
INTRAMUSCULAR | Status: AC
  Filled 2015-08-27: qty 2

## 2015-08-27 MED ORDER — SUGAMMADEX SODIUM 500 MG/5ML IV SOLN
INTRAVENOUS | Status: DC | PRN
  Administered 2015-08-27: 400 mg via INTRAVENOUS

## 2015-08-27 MED ORDER — HYDROMORPHONE HCL 1 MG/ML IJ SOLN
INTRAMUSCULAR | Status: AC
  Filled 2015-08-27: qty 1

## 2015-08-27 MED ORDER — GLYCOPYRROLATE 0.2 MG/ML IJ SOLN
INTRAMUSCULAR | Status: DC | PRN
  Administered 2015-08-27: 0.2 mg via INTRAVENOUS

## 2015-08-27 MED ORDER — SUCCINYLCHOLINE CHLORIDE 200 MG/10ML IV SOSY
PREFILLED_SYRINGE | INTRAVENOUS | Status: AC
  Filled 2015-08-27: qty 10

## 2015-08-27 MED ORDER — EPHEDRINE 5 MG/ML INJ
INTRAVENOUS | Status: AC
  Filled 2015-08-27: qty 10

## 2015-08-27 MED ORDER — LIDOCAINE-EPINEPHRINE 1 %-1:100000 IJ SOLN
INTRAMUSCULAR | Status: AC
  Filled 2015-08-27: qty 3

## 2015-08-27 MED ORDER — OXYMETAZOLINE HCL 0.05 % NA SOLN
NASAL | Status: AC
  Filled 2015-08-27: qty 15

## 2015-08-27 SURGICAL SUPPLY — 40 items
BLADE RAD40 ROTATE 4M 4 5PK (BLADE) IMPLANT
BLADE RAD60 ROTATE M4 4 5PK (BLADE) IMPLANT
BLADE ROTATE TRICUT 4X13 M4 (BLADE) ×4 IMPLANT
BLADE TRICUT 4MM (BLADE) IMPLANT
BLADE TRICUT ROTATE M4 4 5PK (BLADE) IMPLANT
CANISTER SUCTION 2500CC (MISCELLANEOUS) ×4 IMPLANT
COAGULATOR SUCT SWTCH 10FR 6 (ELECTROSURGICAL) ×4 IMPLANT
CONT SPEC 4OZ CLIKSEAL STRL BL (MISCELLANEOUS) ×8 IMPLANT
DRAPE PROXIMA HALF (DRAPES) IMPLANT
DRSG NASOPORE 8CM (GAUZE/BANDAGES/DRESSINGS) ×4 IMPLANT
ELECT REM PT RETURN 9FT ADLT (ELECTROSURGICAL) ×4
ELECTRODE REM PT RTRN 9FT ADLT (ELECTROSURGICAL) ×3 IMPLANT
FILTER ARTHROSCOPY CONVERTOR (FILTER) ×4 IMPLANT
GAUZE SPONGE 2X2 8PLY STRL LF (GAUZE/BANDAGES/DRESSINGS) IMPLANT
GAUZE SPONGE 4X4 16PLY XRAY LF (GAUZE/BANDAGES/DRESSINGS) ×4 IMPLANT
GLOVE BIO SURGEON STRL SZ8 (GLOVE) ×4 IMPLANT
GLOVE ECLIPSE 7.5 STRL STRAW (GLOVE) ×4 IMPLANT
GOWN STRL REUS W/ TWL LRG LVL3 (GOWN DISPOSABLE) ×6 IMPLANT
GOWN STRL REUS W/TWL LRG LVL3 (GOWN DISPOSABLE) ×2
HOVERMATT SINGLE USE (MISCELLANEOUS) ×4 IMPLANT
KIT BASIN OR (CUSTOM PROCEDURE TRAY) ×4 IMPLANT
KIT ROOM TURNOVER OR (KITS) ×4 IMPLANT
NEEDLE HYPO 25GX1X1/2 BEV (NEEDLE) ×4 IMPLANT
NEEDLE SPNL 25GX3.5 QUINCKE BL (NEEDLE) ×4 IMPLANT
NS IRRIG 1000ML POUR BTL (IV SOLUTION) ×4 IMPLANT
PAD ARMBOARD 7.5X6 YLW CONV (MISCELLANEOUS) ×8 IMPLANT
SPLINT NASAL DOYLE BI-VL (GAUZE/BANDAGES/DRESSINGS) ×4 IMPLANT
SPONGE GAUZE 2X2 STER 10/PKG (GAUZE/BANDAGES/DRESSINGS)
SPONGE NEURO XRAY DETECT 1X3 (DISPOSABLE) ×4 IMPLANT
SUT CHROMIC 3 0 SH 27 (SUTURE) ×4 IMPLANT
SUT CHROMIC 4 0 S 4 (SUTURE) ×4 IMPLANT
SUT PLAIN 4 0 ~~LOC~~ 1 (SUTURE) ×4 IMPLANT
SUT PROLENE 2 0 FS (SUTURE) ×4 IMPLANT
TOWEL OR 17X24 6PK STRL BLUE (TOWEL DISPOSABLE) ×4 IMPLANT
TRACKER ENT INSTRUMENT (MISCELLANEOUS) ×4 IMPLANT
TRAY ENT MC OR (CUSTOM PROCEDURE TRAY) ×4 IMPLANT
TUBE CONNECTING 12X1/4 (SUCTIONS) ×4 IMPLANT
TUBE SALEM SUMP 16 FR W/ARV (TUBING) ×4 IMPLANT
TUBING EXTENTION W/L.L. (IV SETS) ×4 IMPLANT
YANKAUER SUCT BULB TIP NO VENT (SUCTIONS) ×4 IMPLANT

## 2015-08-27 NOTE — Discharge Instructions (Signed)

## 2015-08-27 NOTE — Op Note (Signed)
DATE OF PROCEDURE: 08/27/2015  OPERATIVE REPORT   SURGEON: Leta Baptist, MD   PREOPERATIVE DIAGNOSES:  1. Nasal septal deviation.  2. Bilateral inferior turbinate hypertrophy.  3. Chronic nasal obstruction. 4. Bilateral chronic maxillary and ethmoid sinusitis. 5. Left chronic sphenoid sinusitis. 6. Sinonasal polyps.  POSTOPERATIVE DIAGNOSES:  1. Nasal septal deviation.  2. Bilateral inferior turbinate hypertrophy.  3. Chronic nasal obstruction. 4. Bilateral chronic maxillary and ethmoid sinusitis. 5. Left chronic sphenoid sinusitis. 6. Sinonasal polyps.  PROCEDURE PERFORMED:  1. Septoplasty.  2. Bilateral endoscopic total ethmoidectomy 3. Bilateral endoscopic maxillary antrostomy 4. Bilateral partial inferior turbinate resection 5. Left endoscopic sphenoidotomy 6. FUSION stereotactic image guidance   ANESTHESIA: General endotracheal tube anesthesia.   COMPLICATIONS: None.   ESTIMATED BLOOD LOSS: Approximately 400 mL.   INDICATION FOR PROCEDURE: Connie Simpson is a 44 y.o. female with a history of chronic nasal obstruction and chronic rhinosinusitis. The patient was treated with multiple antibiotics, antihistamine, decongestant, steroid nasal spray, and systemic steroids. However, the patient continued to be symptomatic. On examination, the patient was noted to have bilateral severe inferior turbinate hypertrophy and significant nasal septal deviation, causing significant nasal obstruction. Polypoid tissue was also noted to obstruct bilateral middle meatus. On her CT scan, significant opacification of bilateral ethmoid sinuses, bilateral maxillary antrum, and the left sphenoid sinus was noted. Based on the above findings, the decision was made for the patient to undergo the above-stated procedures. The risks, benefits, alternatives, and details of the procedure were discussed with the patient. Questions were invited and answered. Informed consent was obtained.   DESCRIPTION OF  PROCEDURE: The patient was taken to the operating room and placed supine on the operating table. General endotracheal tube anesthesia was administered by the anesthesiologist. The patient was positioned, and prepped and draped in the standard fashion for nasal surgery. FUSION image guidance marker was placed. The image guidance system was functional throughout the case.  Pledgets soaked with Afrin were placed in both nasal cavities for decongestion. The pledgets were subsequently removed. The above mentioned septal deviation was again noted. 1% lidocaine with 1:100,000 epinephrine was injected onto the nasal septum bilaterally. A hemitransfixion incision was made on the left side. The mucosal flap was carefully elevated on the left side. A cartilaginous incision was made 1 cm superior to the caudal margin of the nasal septum. Mucosal flap was also elevated on the right side in the similar fashion. It should be noted that due to the severe septal deviation, the deviated portion of the cartilaginous and bony septum had to be removed in piecemeal fashion. Once the deviated portions were removed, a straight midline septum was achieved. The septum was then quilted with 4-0 plain gut sutures. The hemitransfixion incision was closed with interrupted 4-0 chromic sutures.   Attention was then focused on the left sinonasal cavities. The left middle turbinate was carefully medialized. Polypoid tissue was noted to obstruct the middle meatus. The polypoid tissue was removed. The uncinate process was then resected with a freer elevator. The maxillary antrum was entered and enlarged. The anterior and posterior ethmoid cavities were entered and exposed. A large amount polypoid tissue was removed from both ethmoid sinuses. The left sphenoid sinus was entered. Polypoid tissue was also removed from the left sphenoid sinus. The same procedure was repeated on the right side, except the right sphenoid sinus was not  entered.  Attention was then focused on the inferior turbinates. The inferior one half of both hypertrophied inferior turbinate was crossclamped with a  Kelly clamp. The inferior one half of each inferior turbinate was then resected with a pair of cross cutting scissors. Hemostasis was achieved with a suction cautery device.   Hemostasis was achieved with the Nasopore packing. Doyle splints were applied to the nasal septum bilaterally.  The care of the patient was turned over to the anesthesiologist. The patient was awakened from anesthesia without difficulty. The patient was extubated and transferred to the recovery room in good condition.   OPERATIVE FINDINGS: Severe nasal septal deviation and bilateral inferior turbinate hypertrophy. Bilateral chronic maxillary and ethmoid sinusitis, with significant polyposis. Polypoid tissue was also noted within the left sphenoid sinus.  SPECIMEN: Bilateral sinus contents.  FOLLOWUP CARE: The patient be discharged home once she is awake and alert. The patient will be placed on Percocet 1 tablet p.o. q.4 hours p.r.n. pain, and amoxicillin 875 mg p.o. b.i.d. for 5 days. The patient will follow up in my office in approximately 1 week for splint removal.   Connie Dewoody Raynelle Bring, MD

## 2015-08-27 NOTE — Anesthesia Procedure Notes (Signed)
Procedure Name: Intubation Date/Time: 08/27/2015 8:45 AM Performed by: Layla Maw Pre-anesthesia Checklist: Patient identified Patient Re-evaluated:Patient Re-evaluated prior to inductionOxygen Delivery Method: Circle system utilized Preoxygenation: Pre-oxygenation with 100% oxygen Intubation Type: IV induction Ventilation: Oral airway inserted - appropriate to patient size and Mask ventilation without difficulty Laryngoscope Size: Mac and 3 Grade View: Grade I Tube type: Oral Rae Tube size: 7.0 mm Number of attempts: 1 Airway Equipment and Method: LTA kit utilized,  Oral airway and Patient positioned with wedge pillow Placement Confirmation: ETT inserted through vocal cords under direct vision,  positive ETCO2 and breath sounds checked- equal and bilateral Secured at: 20 cm Tube secured with: Tape Dental Injury: Teeth and Oropharynx as per pre-operative assessment

## 2015-08-27 NOTE — Anesthesia Postprocedure Evaluation (Signed)
Anesthesia Post Note  Patient: Connie Simpson  Procedure(s) Performed: Procedure(s) (LRB): SINUS ENDO WITH FUSION (Bilateral) ENDOSCOPIC TOTAL ETHMOIDECTOMY (Bilateral) ENDOSCOPIC BILATERAL MAXILLARY ANTROSTOMY (Bilateral) LEFT ENDOSCOPIC SPHENOIDECTOMY (Left) NASAL SEPTOPLASTY WITH TURBINATE REDUCTION (N/A)  Patient location during evaluation: PACU Anesthesia Type: General Level of consciousness: awake and alert Pain management: pain level controlled Vital Signs Assessment: post-procedure vital signs reviewed and stable Respiratory status: spontaneous breathing, nonlabored ventilation, respiratory function stable and patient connected to nasal cannula oxygen Cardiovascular status: blood pressure returned to baseline and stable Postop Assessment: no signs of nausea or vomiting Anesthetic complications: no    Last Vitals:  Filed Vitals:   08/27/15 1304 08/27/15 1328  BP: 100/63   Pulse: 63   Temp: 36.4 C 36.6 C  Resp: 11     Last Pain:  Filed Vitals:   08/27/15 1328  PainSc: 4                  Tiajuana Amass

## 2015-08-27 NOTE — H&P (Signed)
Cc: Chronic rhinosinusitis, nasal obstruction  HPI:  The patient is a 45 year old female with a history of chronic rhinosinusitis.  At her last visit, she was noted to have chronic rhinosinusitis, nasal septal deviation and bilateral inferior turbinate hypertrophy.  Polypoid tissue was also noted to obstruct her middle meatus bilaterally.  The patient was treated with Prednisone Dosepak and Flonase nasal spray.  She also underwent a paranasal sinus CT scan.  The CT showed significant opacification of her bilateral ethmoid sinuses and left sphenoid sinus.  Her ostiomeatal complex was noted to be occluded bilaterally.  The patient returns complaining of persistent facial pain and pressure, nasal congestion, and persistent nasal drainage.  She is interested in more definitive treatment of her condition.  Exam: The nasal cavities were decongested and anesthetised with a combination of oxymetazoline and 4% lidocaine solution.  The flexible scope was inserted into the right nasal cavity.  Endoscopy of the inferior and middle meatus was performed.  Edematous mucosa was noted. Polypoid tissue was noted to obstruct the middle meatus. Mucopurulent drainage was noted.  Olfactory cleft was clear.  Nasopharynx was clear.  NSD to the right. Turbinates were hypertrophied but without mass.  Incomplete response to decongestion.  The procedure was repeated on the contralateral side with similar findings.  The patient tolerated the procedure well.  Instructions were given to avoid eating or drinking for 2 hours.   Assessment: 1.  Bilateral chronic rhinosinusitis, involving both maxillary and ethmoid sinuses as well as the left sphenoid sinus.  Mucopurulent drainage is noted today bilaterally.   2.  Significant rightward nasal septal deviation and bilateral inferior turbinate hypertrophy.  More than 95% of the nasal passageway is obstructed bilaterally.   Plan: 1.  The nasal endoscopy findings and the CT images are  reviewed with the patient.  2.  Augmentin 875 mg p.o. b.i.d. for 10 days.  3.  Repeat Prednisone Dosepak for 6 days.  4.  Based on the above findings, the patient will likely benefit from undergoing bilateral endoscopic sinus surgery, septoplasty, and bilateral turbinate reduction to improve her sinus drainage pathway and her nasal airway.  The risks, benefits, alternatives and details of the procedures are extensively reviewed.  5.  The patient would like to proceed with the procedures.

## 2015-08-27 NOTE — Transfer of Care (Signed)
Immediate Anesthesia Transfer of Care Note  Patient: Connie Simpson  Procedure(s) Performed: Procedure(s): SINUS ENDO WITH FUSION (Bilateral) ENDOSCOPIC TOTAL ETHMOIDECTOMY (Bilateral) ENDOSCOPIC BILATERAL MAXILLARY ANTROSTOMY (Bilateral) LEFT ENDOSCOPIC SPHENOIDECTOMY (Left) NASAL SEPTOPLASTY WITH TURBINATE REDUCTION (N/A)  Patient Location: PACU  Anesthesia Type:General  Level of Consciousness: awake, sedated and patient cooperative  Airway & Oxygen Therapy: Patient Spontanous Breathing and Patient connected to face mask oxygen  Post-op Assessment: Report given to RN, Post -op Vital signs reviewed and stable and Patient moving all extremities X 4  Post vital signs: Reviewed and stable  Last Vitals:  Filed Vitals:   08/27/15 0653  BP: 131/75  Pulse: 69  Temp: 36.6 C  Resp: 20    Last Pain: There were no vitals filed for this visit.       Complications: No apparent anesthesia complications

## 2015-08-28 ENCOUNTER — Encounter (HOSPITAL_COMMUNITY): Payer: Self-pay | Admitting: Otolaryngology

## 2015-09-01 ENCOUNTER — Ambulatory Visit (INDEPENDENT_AMBULATORY_CARE_PROVIDER_SITE_OTHER): Payer: 59 | Admitting: Otolaryngology

## 2015-09-01 DIAGNOSIS — J320 Chronic maxillary sinusitis: Secondary | ICD-10-CM | POA: Diagnosis not present

## 2015-09-01 DIAGNOSIS — J323 Chronic sphenoidal sinusitis: Secondary | ICD-10-CM

## 2015-09-01 DIAGNOSIS — J322 Chronic ethmoidal sinusitis: Secondary | ICD-10-CM | POA: Diagnosis not present

## 2015-09-15 ENCOUNTER — Ambulatory Visit (INDEPENDENT_AMBULATORY_CARE_PROVIDER_SITE_OTHER): Payer: 59 | Admitting: Otolaryngology

## 2015-09-15 DIAGNOSIS — J323 Chronic sphenoidal sinusitis: Secondary | ICD-10-CM

## 2015-09-15 DIAGNOSIS — J322 Chronic ethmoidal sinusitis: Secondary | ICD-10-CM

## 2015-09-15 DIAGNOSIS — J320 Chronic maxillary sinusitis: Secondary | ICD-10-CM | POA: Diagnosis not present

## 2015-10-06 ENCOUNTER — Ambulatory Visit (INDEPENDENT_AMBULATORY_CARE_PROVIDER_SITE_OTHER): Payer: 59 | Admitting: Otolaryngology

## 2015-10-06 DIAGNOSIS — J322 Chronic ethmoidal sinusitis: Secondary | ICD-10-CM | POA: Diagnosis not present

## 2015-10-06 DIAGNOSIS — J320 Chronic maxillary sinusitis: Secondary | ICD-10-CM

## 2015-10-06 DIAGNOSIS — J323 Chronic sphenoidal sinusitis: Secondary | ICD-10-CM | POA: Diagnosis not present

## 2016-01-12 ENCOUNTER — Ambulatory Visit (INDEPENDENT_AMBULATORY_CARE_PROVIDER_SITE_OTHER): Payer: 59 | Admitting: Otolaryngology

## 2016-01-12 DIAGNOSIS — J320 Chronic maxillary sinusitis: Secondary | ICD-10-CM

## 2016-01-12 DIAGNOSIS — J310 Chronic rhinitis: Secondary | ICD-10-CM | POA: Diagnosis not present

## 2016-03-30 DIAGNOSIS — R1011 Right upper quadrant pain: Secondary | ICD-10-CM | POA: Diagnosis not present

## 2016-03-31 DIAGNOSIS — R1011 Right upper quadrant pain: Secondary | ICD-10-CM | POA: Diagnosis not present

## 2016-03-31 DIAGNOSIS — K8080 Other cholelithiasis without obstruction: Secondary | ICD-10-CM | POA: Diagnosis not present

## 2016-04-07 DIAGNOSIS — K8050 Calculus of bile duct without cholangitis or cholecystitis without obstruction: Secondary | ICD-10-CM | POA: Diagnosis not present

## 2016-04-12 DIAGNOSIS — K8050 Calculus of bile duct without cholangitis or cholecystitis without obstruction: Secondary | ICD-10-CM | POA: Diagnosis not present

## 2016-04-12 DIAGNOSIS — Z881 Allergy status to other antibiotic agents status: Secondary | ICD-10-CM | POA: Diagnosis not present

## 2016-04-13 DIAGNOSIS — K8020 Calculus of gallbladder without cholecystitis without obstruction: Secondary | ICD-10-CM | POA: Diagnosis not present

## 2016-04-13 DIAGNOSIS — Z881 Allergy status to other antibiotic agents status: Secondary | ICD-10-CM | POA: Diagnosis not present

## 2016-04-13 DIAGNOSIS — I10 Essential (primary) hypertension: Secondary | ICD-10-CM | POA: Diagnosis not present

## 2016-04-13 DIAGNOSIS — J45909 Unspecified asthma, uncomplicated: Secondary | ICD-10-CM | POA: Diagnosis not present

## 2016-04-13 DIAGNOSIS — K8050 Calculus of bile duct without cholangitis or cholecystitis without obstruction: Secondary | ICD-10-CM | POA: Diagnosis not present

## 2016-07-19 ENCOUNTER — Ambulatory Visit (INDEPENDENT_AMBULATORY_CARE_PROVIDER_SITE_OTHER): Payer: 59 | Admitting: Otolaryngology

## 2016-07-19 DIAGNOSIS — J310 Chronic rhinitis: Secondary | ICD-10-CM

## 2016-07-20 DIAGNOSIS — Z0001 Encounter for general adult medical examination with abnormal findings: Secondary | ICD-10-CM | POA: Diagnosis not present

## 2016-07-22 DIAGNOSIS — Z0001 Encounter for general adult medical examination with abnormal findings: Secondary | ICD-10-CM | POA: Diagnosis not present

## 2016-07-22 DIAGNOSIS — E039 Hypothyroidism, unspecified: Secondary | ICD-10-CM | POA: Diagnosis not present

## 2016-10-21 DIAGNOSIS — J069 Acute upper respiratory infection, unspecified: Secondary | ICD-10-CM | POA: Diagnosis not present

## 2016-10-21 DIAGNOSIS — J4541 Moderate persistent asthma with (acute) exacerbation: Secondary | ICD-10-CM | POA: Diagnosis not present

## 2017-01-21 DIAGNOSIS — E039 Hypothyroidism, unspecified: Secondary | ICD-10-CM | POA: Diagnosis not present

## 2017-01-21 DIAGNOSIS — Z1389 Encounter for screening for other disorder: Secondary | ICD-10-CM | POA: Diagnosis not present

## 2017-01-24 ENCOUNTER — Ambulatory Visit (INDEPENDENT_AMBULATORY_CARE_PROVIDER_SITE_OTHER): Payer: 59 | Admitting: Otolaryngology

## 2017-01-24 DIAGNOSIS — J330 Polyp of nasal cavity: Secondary | ICD-10-CM

## 2017-01-24 DIAGNOSIS — J322 Chronic ethmoidal sinusitis: Secondary | ICD-10-CM

## 2017-01-24 DIAGNOSIS — J320 Chronic maxillary sinusitis: Secondary | ICD-10-CM

## 2017-02-15 DIAGNOSIS — G4489 Other headache syndrome: Secondary | ICD-10-CM | POA: Diagnosis not present

## 2017-02-15 DIAGNOSIS — E039 Hypothyroidism, unspecified: Secondary | ICD-10-CM | POA: Diagnosis not present

## 2017-02-24 ENCOUNTER — Other Ambulatory Visit: Payer: Self-pay | Admitting: Family Medicine

## 2017-02-24 DIAGNOSIS — R519 Headache, unspecified: Secondary | ICD-10-CM

## 2017-02-24 DIAGNOSIS — D352 Benign neoplasm of pituitary gland: Secondary | ICD-10-CM

## 2017-02-24 DIAGNOSIS — R51 Headache: Principal | ICD-10-CM

## 2017-03-18 ENCOUNTER — Ambulatory Visit
Admission: RE | Admit: 2017-03-18 | Discharge: 2017-03-18 | Disposition: A | Discharge status: Home / Self Care | Payer: 59 | Source: Ambulatory Visit | Attending: Family Medicine | Admitting: Family Medicine

## 2017-03-18 DIAGNOSIS — R519 Headache, unspecified: Secondary | ICD-10-CM

## 2017-03-18 DIAGNOSIS — R51 Headache: Principal | ICD-10-CM

## 2017-03-18 DIAGNOSIS — D352 Benign neoplasm of pituitary gland: Secondary | ICD-10-CM

## 2017-03-18 MED ORDER — GADOBENATE DIMEGLUMINE 529 MG/ML IV SOLN
10.0000 mL | Freq: Once | INTRAVENOUS | Status: AC | PRN
  Administered 2017-03-18: 10 mL via INTRAVENOUS

## 2017-06-07 DIAGNOSIS — M722 Plantar fascial fibromatosis: Secondary | ICD-10-CM | POA: Diagnosis not present

## 2017-06-07 DIAGNOSIS — M79671 Pain in right foot: Secondary | ICD-10-CM | POA: Diagnosis not present

## 2017-06-07 DIAGNOSIS — M25579 Pain in unspecified ankle and joints of unspecified foot: Secondary | ICD-10-CM | POA: Diagnosis not present

## 2017-06-21 DIAGNOSIS — M25579 Pain in unspecified ankle and joints of unspecified foot: Secondary | ICD-10-CM | POA: Diagnosis not present

## 2017-06-21 DIAGNOSIS — M79671 Pain in right foot: Secondary | ICD-10-CM | POA: Diagnosis not present

## 2017-07-25 DIAGNOSIS — Z0001 Encounter for general adult medical examination with abnormal findings: Secondary | ICD-10-CM | POA: Diagnosis not present

## 2017-07-26 DIAGNOSIS — M76821 Posterior tibial tendinitis, right leg: Secondary | ICD-10-CM | POA: Diagnosis not present

## 2017-07-26 DIAGNOSIS — M25579 Pain in unspecified ankle and joints of unspecified foot: Secondary | ICD-10-CM | POA: Diagnosis not present

## 2017-07-26 DIAGNOSIS — M79671 Pain in right foot: Secondary | ICD-10-CM | POA: Diagnosis not present

## 2017-08-01 DIAGNOSIS — J4541 Moderate persistent asthma with (acute) exacerbation: Secondary | ICD-10-CM | POA: Diagnosis not present

## 2017-08-01 DIAGNOSIS — G43919 Migraine, unspecified, intractable, without status migrainosus: Secondary | ICD-10-CM | POA: Diagnosis not present

## 2017-08-03 DIAGNOSIS — M25571 Pain in right ankle and joints of right foot: Secondary | ICD-10-CM | POA: Diagnosis not present

## 2017-08-03 DIAGNOSIS — M19071 Primary osteoarthritis, right ankle and foot: Secondary | ICD-10-CM | POA: Diagnosis not present

## 2017-08-03 DIAGNOSIS — M7138 Other bursal cyst, other site: Secondary | ICD-10-CM | POA: Diagnosis not present

## 2017-08-03 DIAGNOSIS — R600 Localized edema: Secondary | ICD-10-CM | POA: Diagnosis not present

## 2017-08-03 DIAGNOSIS — S99911A Unspecified injury of right ankle, initial encounter: Secondary | ICD-10-CM | POA: Diagnosis not present

## 2017-08-10 DIAGNOSIS — M65871 Other synovitis and tenosynovitis, right ankle and foot: Secondary | ICD-10-CM | POA: Diagnosis not present

## 2017-08-10 DIAGNOSIS — M722 Plantar fascial fibromatosis: Secondary | ICD-10-CM | POA: Diagnosis not present

## 2017-08-10 DIAGNOSIS — M79671 Pain in right foot: Secondary | ICD-10-CM | POA: Diagnosis not present

## 2017-08-11 ENCOUNTER — Ambulatory Visit (INDEPENDENT_AMBULATORY_CARE_PROVIDER_SITE_OTHER): Payer: 59 | Admitting: Otolaryngology

## 2017-08-11 DIAGNOSIS — J310 Chronic rhinitis: Secondary | ICD-10-CM | POA: Diagnosis not present

## 2017-08-11 DIAGNOSIS — J330 Polyp of nasal cavity: Secondary | ICD-10-CM

## 2017-08-30 DIAGNOSIS — M79671 Pain in right foot: Secondary | ICD-10-CM | POA: Diagnosis not present

## 2017-08-30 DIAGNOSIS — M25579 Pain in unspecified ankle and joints of unspecified foot: Secondary | ICD-10-CM | POA: Diagnosis not present

## 2017-08-30 DIAGNOSIS — M76821 Posterior tibial tendinitis, right leg: Secondary | ICD-10-CM | POA: Diagnosis not present

## 2017-10-11 DIAGNOSIS — M79672 Pain in left foot: Secondary | ICD-10-CM | POA: Diagnosis not present

## 2017-10-11 DIAGNOSIS — M25579 Pain in unspecified ankle and joints of unspecified foot: Secondary | ICD-10-CM | POA: Diagnosis not present

## 2017-10-11 DIAGNOSIS — M79671 Pain in right foot: Secondary | ICD-10-CM | POA: Diagnosis not present

## 2017-10-15 DIAGNOSIS — R0600 Dyspnea, unspecified: Secondary | ICD-10-CM | POA: Diagnosis not present

## 2017-10-15 DIAGNOSIS — R05 Cough: Secondary | ICD-10-CM | POA: Diagnosis not present

## 2017-10-15 DIAGNOSIS — J4541 Moderate persistent asthma with (acute) exacerbation: Secondary | ICD-10-CM | POA: Diagnosis not present

## 2017-10-15 DIAGNOSIS — R5383 Other fatigue: Secondary | ICD-10-CM | POA: Diagnosis not present

## 2017-11-22 DIAGNOSIS — M79671 Pain in right foot: Secondary | ICD-10-CM | POA: Diagnosis not present

## 2017-11-22 DIAGNOSIS — M25579 Pain in unspecified ankle and joints of unspecified foot: Secondary | ICD-10-CM | POA: Diagnosis not present

## 2017-11-22 DIAGNOSIS — M79672 Pain in left foot: Secondary | ICD-10-CM | POA: Diagnosis not present

## 2018-02-01 DIAGNOSIS — G43919 Migraine, unspecified, intractable, without status migrainosus: Secondary | ICD-10-CM | POA: Diagnosis not present

## 2018-02-01 DIAGNOSIS — J4541 Moderate persistent asthma with (acute) exacerbation: Secondary | ICD-10-CM | POA: Diagnosis not present

## 2018-02-02 ENCOUNTER — Ambulatory Visit (INDEPENDENT_AMBULATORY_CARE_PROVIDER_SITE_OTHER): Payer: 59 | Admitting: Otolaryngology

## 2018-02-02 DIAGNOSIS — J338 Other polyp of sinus: Secondary | ICD-10-CM | POA: Diagnosis not present

## 2018-02-02 DIAGNOSIS — J322 Chronic ethmoidal sinusitis: Secondary | ICD-10-CM | POA: Diagnosis not present

## 2018-02-02 DIAGNOSIS — J310 Chronic rhinitis: Secondary | ICD-10-CM | POA: Diagnosis not present

## 2018-03-27 DIAGNOSIS — J4541 Moderate persistent asthma with (acute) exacerbation: Secondary | ICD-10-CM | POA: Diagnosis not present

## 2018-03-27 DIAGNOSIS — J0101 Acute recurrent maxillary sinusitis: Secondary | ICD-10-CM | POA: Diagnosis not present

## 2018-03-27 DIAGNOSIS — J209 Acute bronchitis, unspecified: Secondary | ICD-10-CM | POA: Diagnosis not present

## 2018-05-25 DIAGNOSIS — R05 Cough: Secondary | ICD-10-CM | POA: Diagnosis not present

## 2018-05-25 DIAGNOSIS — J4551 Severe persistent asthma with (acute) exacerbation: Secondary | ICD-10-CM | POA: Diagnosis not present

## 2018-05-25 DIAGNOSIS — J302 Other seasonal allergic rhinitis: Secondary | ICD-10-CM | POA: Diagnosis not present

## 2018-06-08 DIAGNOSIS — J4551 Severe persistent asthma with (acute) exacerbation: Secondary | ICD-10-CM | POA: Diagnosis not present

## 2018-06-08 DIAGNOSIS — J302 Other seasonal allergic rhinitis: Secondary | ICD-10-CM | POA: Diagnosis not present

## 2018-06-08 DIAGNOSIS — R05 Cough: Secondary | ICD-10-CM | POA: Diagnosis not present

## 2018-08-01 DIAGNOSIS — G43919 Migraine, unspecified, intractable, without status migrainosus: Secondary | ICD-10-CM | POA: Diagnosis not present

## 2018-08-01 DIAGNOSIS — J4541 Moderate persistent asthma with (acute) exacerbation: Secondary | ICD-10-CM | POA: Diagnosis not present

## 2018-10-31 ENCOUNTER — Institutional Professional Consult (permissible substitution): Payer: 59 | Admitting: Pulmonary Disease

## 2018-11-02 ENCOUNTER — Ambulatory Visit (INDEPENDENT_AMBULATORY_CARE_PROVIDER_SITE_OTHER): Payer: 59 | Admitting: Otolaryngology

## 2018-11-02 DIAGNOSIS — J310 Chronic rhinitis: Secondary | ICD-10-CM

## 2018-11-02 DIAGNOSIS — J322 Chronic ethmoidal sinusitis: Secondary | ICD-10-CM

## 2018-11-02 DIAGNOSIS — J320 Chronic maxillary sinusitis: Secondary | ICD-10-CM

## 2018-11-13 NOTE — Progress Notes (Signed)
Subjective:   PATIENT ID: Connie Simpson GENDER: female DOB: 07/06/1970, MRN: [id number]   HPI  Chief Complaint  Patient presents with  . Consult    Reason for Visit: New consult for asthma  Ms. Connie Simpson is a 48 year old female with hx of asthma, hx of sinus surgery for nasal polyps, allergic rhinitis who presents for asthma management.   Records reviewed from [facility].  Office note from 09/14/2018 by Lanelle Bal, PA and Gar Ponto, MD.  Summarized as follows: Telehealth visit-patient reports trouble with asthma for the last 2 weeks associated with wheezing.  Cough is acute, dry and hacking.  Associated with sinus congestion.  Diagnosed with asthma exacerbation.  She was prescribed 40 mg prednisone x5 days and Z-Pak.  Referred to [facility]  She reports history of non-adherence when her symptoms are better. She does take Singulair and zyrtec daily for allergies. Does not use her nasal sprays routinely. She has chronic wheeze, shortness of breath with activity. Sometimes after sneezing or coughing, she will develop shortness of breath. She coughs daily with mixed productive/unproductive with clear thick sputum. Has been taking mucinex in the last few weeks and has improved in mucous clearance. In the last month, she reports her asthma is uncontrolled most of time. This week is better after one week of vacation and using nebulizer more than 4 times a day. She wakes up 2-3 times a week at night due to symptoms. She has daily shortness of breath. No improvement in symptoms when she leaves her home. Denies any ED or hospitalizations in the last two years. Denies chest pain. Reports reflux  ACT 7  Exacerbations requiring steroids: 2019 Jan Feb March April May June July Aug Sept Oct Nov Dec             X   2020 Jan Feb March April May June July Aug Sept Oct Nov Dec        X         Social History: Never smoker Work a Public relations account executive  exposures: Lives in older home built in the 1930s   I have personally reviewed patient's past medical/family/social history, allergies, current medications.  Past Medical History:  Diagnosis Date  . Anxiety   . Arthritis    knees bilateral  . Asthma   . Depression   . Headache    occasional  . History of bronchitis   . History of pneumonia   . Hypothyroidism   . PCOS (polycystic ovarian syndrome)   . Pneumonia   . Thyroid disease      Family History  Problem Relation Age of Onset  . Diabetes Mother   . Hypertension Mother   . Heart disease Father   . Hypertension Father   . Asthma Brother   . Allergies Brother   . Thyroid disease Sister      Social History   Occupational History  . Not on file  Tobacco Use  . Smoking status: Former Smoker    Packs/day: 0.05    Years: 3.00    Pack years: 0.15    Types: Cigarettes    Start date: 59    Quit date: 11/14/1990    Years since quitting: 28.0  . Tobacco comment: 2-3 a week at most  Substance and Sexual Activity  . Alcohol use: Yes    Comment: occasionally  . Drug use: No  . Sexual activity: Not on file    Allergies  Allergen Reactions  . Erythromycin Anaphylaxis, Shortness Of Breath and Swelling     Outpatient Medications Prior to Visit  Medication Sig Dispense Refill  . ADVAIR DISKUS 500-50 MCG/DOSE AEPB Take 1 puff by mouth daily.  12  . albuterol (PROVENTIL) (2.5 MG/3ML) 0.083% nebulizer solution Take 2.5 mg by nebulization every 6 (six) hours as needed for wheezing.     Marland Kitchen ALPRAZolam (XANAX) 0.5 MG tablet Take 0.5 mg by mouth at bedtime.   5  . ascorbic acid (VITAMIN C) 1000 MG tablet Take 1,000 mg by mouth daily.    . B Complex-C (SUPER B COMPLEX PO) Take 1 tablet by mouth daily.    . Cetirizine-Pseudoephedrine (ZYRTEC-D PO) Take 1 tablet by mouth daily.    . cyclobenzaprine (FLEXERIL) 10 MG tablet Take 10 mg by mouth at bedtime.  3  . fluticasone (FLONASE) 50 MCG/ACT nasal spray USE TWO SPRAYS IN EACH  NOSTRIL EVERY MORNING.    Marland Kitchen levothyroxine (SYNTHROID) 100 MCG tablet Take 1 tablet by mouth daily.    . metFORMIN (GLUCOPHAGE-XR) 500 MG 24 hr tablet Take 500 mg by mouth 2 (two) times daily.  3  . montelukast (SINGULAIR) 10 MG tablet Take 10 mg by mouth every morning.  3  . sertraline (ZOLOFT) 100 MG tablet Take 100 mg by mouth daily.  5  . VENTOLIN HFA 108 (90 BASE) MCG/ACT inhaler INHALE TWO PUFFS EVERY 4 TO 6 HOURS AS NEEDED FOR COUGH OR WHEEZE  12  . Biotin (RA BIOTIN) 1000 MCG tablet Take 1,000 mcg by mouth daily.    . SUMAtriptan (IMITREX) 50 MG tablet TAKE ONE TABLET BY MOUTH DAILY FOR ACUTE MIGRAINE.    Marland Kitchen amoxicillin (AMOXIL) 875 MG tablet Take 1 tablet (875 mg total) by mouth 2 (two) times daily. (Patient not taking: Reported on 11/14/2018) 10 tablet 0  . levothyroxine (SYNTHROID, LEVOTHROID) 112 MCG tablet Take 112 mcg by mouth daily.  99  . oxyCODONE-acetaminophen (ROXICET) 5-325 MG tablet Take 1 tablet by mouth every 4 (four) hours as needed for severe pain. (Patient not taking: Reported on 11/14/2018) 25 tablet 0   No facility-administered medications prior to visit.     Review of Systems  Constitutional: Negative for chills, diaphoresis, fever, malaise/fatigue and weight loss.  HENT: Positive for congestion. Negative for ear pain and sore throat.   Respiratory: Positive for cough, sputum production, shortness of breath and wheezing. Negative for hemoptysis.   Cardiovascular: Negative for chest pain, palpitations and leg swelling.  Gastrointestinal: Positive for nausea. Negative for abdominal pain and heartburn.  Genitourinary: Negative for frequency.  Musculoskeletal: Negative for joint pain and myalgias.  Skin: Negative for itching and rash.  Neurological: Negative for dizziness, weakness and headaches.  Endo/Heme/Allergies: Positive for environmental allergies. Does not bruise/bleed easily.  Psychiatric/Behavioral: Negative for depression. The patient is not nervous/anxious.       Objective:   Vitals:   11/14/18 1032  Temp: 98.2 F (36.8 C)  TempSrc: Oral  Weight: (!) 370 lb (167.8 kg)  Height: 5\' 5"  (1.651 m)     Physical Exam: General: Well-appearing, no acute distress HENT: [place], AT Eyes: EOMI, no scleral icterus Respiratory: Clear to auscultation bilaterally.  No crackles, wheezing or rales Cardiovascular: RRR, -M/R/G, no JVD GI: BS+, soft, nontender Extremities:-Edema,-tenderness Neuro: AAO x4, CNII-XII grossly intact Skin: Intact, no rashes or bruising Psych: Normal mood, normal affect  Data Reviewed:  Imaging: None on file  PFT: None on file  Labs: OSH 02/01/2018 CBC and BMP  WBC 8.3 Eos 10% (absolute 800). BUN 7 creatinine 0.69  Imaging, labs and tests noted above have been reviewed independently by me.    Assessment & Plan:   Discussion: 48 year old female female with severe persistent asthma who presents as new consult to establish to asthma. Marked eosinophilia on OSH labs from 01/2018 with abs eosinophilia 800.  Severe persistent eosinophilic pneumonia --INCREASE Advair 1 puff twice a day. We will call your pharmacy to find out the dose --CONTINUE Albuterol 2 puffs as need for shortness of breath or wheezing --CONTINUE singulair and zyrtec  Asthma Action Plan: For asthma exacerbations (coughing, wheezing shortness of breath), take Advair 2 puffs twice a day. Call our office or present to urgent care for persistent symptoms to evaluate need for prednisone  Health Maintenance Immunization History  Administered Date(s) Administered  . Influenza Inj Mdck Quad Pf 01/04/2018   CT Lung Screen - Not qualified  Orders Placed This Encounter  Procedures  . Pulmonary function test    Standing Status:   Future    Standing Expiration Date:   11/14/2019    Order Specific Question:   Where should this test be performed?    Answer:   [facility]    Order Specific Question:   Full PFT: includes the following: basic spirometry,  spirometry pre & post bronchodilator, diffusion capacity (DLCO), lung volumes    Answer:   Full PFT   Meds ordered this encounter  Medications  . ADVAIR DISKUS 500-50 MCG/DOSE AEPB    Sig: Inhale 1 puff into the lungs 2 (two) times daily.    Dispense:  60 each    Refill:  12    Return in about 3 months (around 02/14/2019).  [place], MD [facility] 11/14/2018 11:05 AM  Office Number [phone]

## 2018-11-14 ENCOUNTER — Ambulatory Visit: Payer: 59 | Admitting: Pulmonary Disease

## 2018-11-14 ENCOUNTER — Encounter: Payer: Self-pay | Admitting: Pulmonary Disease

## 2018-11-14 ENCOUNTER — Other Ambulatory Visit: Payer: Self-pay

## 2018-11-14 VITALS — Temp 98.2°F | Ht 65.0 in | Wt 370.0 lb

## 2018-11-14 DIAGNOSIS — J4550 Severe persistent asthma, uncomplicated: Secondary | ICD-10-CM | POA: Insufficient documentation

## 2018-11-14 MED ORDER — ADVAIR DISKUS 500-50 MCG/DOSE IN AEPB: 1.0000 | INHALATION_SPRAY | Freq: Two times a day (BID) | RESPIRATORY_TRACT | 12 refills | Status: AC

## 2018-11-14 NOTE — Patient Instructions (Addendum)
Severe Persistent Eosinophilic Asthma --INCREASE Advair 1 puff twice a day. We will call your pharmacy to find out the dose --CONTINUE Albuterol 2 puffs as need for shortness of breath or wheezing --CONTINUE singulair and zyrtec  Asthma Action Plan: For asthma exacerbations (coughing, wheezing shortness of breath), take Advair 2 puffs twice a day. Call our office or present to urgent care for persistent symptoms to evaluate need for prednisone  Follow-up with me in 3 months

## 2019-04-18 ENCOUNTER — Telehealth (INDEPENDENT_AMBULATORY_CARE_PROVIDER_SITE_OTHER): Payer: 59 | Admitting: Pulmonary Disease

## 2019-04-18 ENCOUNTER — Encounter: Payer: Self-pay | Admitting: Pulmonary Disease

## 2019-04-18 DIAGNOSIS — Z8616 Personal history of COVID-19: Secondary | ICD-10-CM

## 2019-04-18 DIAGNOSIS — J8283 Eosinophilic asthma: Secondary | ICD-10-CM

## 2019-04-18 DIAGNOSIS — Z7951 Long term (current) use of inhaled steroids: Secondary | ICD-10-CM

## 2019-04-18 DIAGNOSIS — F4321 Adjustment disorder with depressed mood: Secondary | ICD-10-CM

## 2019-04-18 DIAGNOSIS — J4550 Severe persistent asthma, uncomplicated: Secondary | ICD-10-CM

## 2019-04-18 DIAGNOSIS — J8281 Chronic eosinophilic pneumonia: Secondary | ICD-10-CM | POA: Diagnosis not present

## 2019-04-18 NOTE — Patient Instructions (Signed)
Severe persistent eosinophilic pneumonia --CONTINUE Advair 500/50 1 puff twice a day  --CONTINUE Albuterol 2 puffs as need for shortness of breath or wheezing --CONTINUE singulair --CONTINUE zyrtec --Scheduled pulmonary function tests on 3/9  Asthma Action Plan: For asthma exacerbations (coughing, wheezing shortness of breath), take Advair 2 puffs twice a day. Call our office or present to urgent care for persistent symptoms to evaluate need for prednisone

## 2019-04-18 NOTE — Progress Notes (Signed)
Virtual Visit via Video Note  I connected with Connie Simpson on 04/18/19 at  4:00 PM EST by a video enabled telemedicine application and verified that I am speaking with the correct person using two identifiers.  Location: Patient: Home Provider: [facility] office   I discussed the limitations of evaluation and management by telemedicine and the availability of in person appointments. The patient expressed understanding and agreed to proceed.   I discussed the assessment and treatment plan with the patient. The patient was provided an opportunity to ask questions and all were answered. The patient agreed with the plan and demonstrated an understanding of the instructions.   The patient was advised to call back or seek an in-person evaluation if the symptoms worsen or if the condition fails to improve as anticipated.  I provided 35 minutes of non-face-to-face time during this encounter.   Connie Mangione Rodman Pickle, MD    Subjective:   PATIENT ID: Connie Simpson: April 23, 1970, MRN: [id number]   HPI  Chief Complaint  Patient presents with  . Follow-up    Patient has been having wheezing, cough and shortness of breath. Patient still has cough but has got better. Patient went to PCP 1 week ago for wheezing and was given prednisone and antibiotic and has noticed a difference. Patient was Covid positive in December.     Reason for Visit: Follow-up  Ms. Connie Simpson is a 49 year old female with hx of asthma, hx of sinus surgery for nasal polyps, allergic rhinitis who presents for asthma management.   She recently had symptoms of productive cough with wheezing. She was seen by her PCP and treated with antibiotics and steroids one week. She is taking Advair 500/50 1 puff twice a day, though admits she was not consistent with this previously. She does not use her albuterol however does report that she needs it several times a day. 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  Of note, she was diagnosed with COVID in December along with co-workers, daughter and co-workers' family members. She has had recently been stressed as her brother passed away from pneumonia in 2022/05/02. With her recent infection and her brother's passing, she would like to make sure she optimizes her respiratory health.  Since her diagnosis of COVID, she reports less energy and activity. She also feels like her depression is part of this as well. There are multiple family members who have passed from [place].    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  Past Medical History:  Diagnosis Date  . Anxiety   . Arthritis    knees bilateral  . Asthma   . Depression   . Headache    occasional  . History of bronchitis   . History of pneumonia   . Hypothyroidism   . PCOS (polycystic ovarian syndrome)   . PCOS (polycystic ovarian syndrome)   . Pneumonia   .  Restless leg   . Thyroid disease      Family History  Problem Relation Age of Onset  . Diabetes Mother   . Hypertension Mother   . Heart disease Father   . Hypertension Father   . Asthma Brother   . Allergies Brother   . Thyroid disease Sister      Social History   Occupational History  . Not on file  Tobacco Use  . Smoking status: Former Smoker    Packs/day: 0.05    Years: 3.00    Pack years: 0.15    Types: Cigarettes    Start date: 31    Quit date: 11/14/1990    Years since quitting: 28.4  . Smokeless tobacco: Never Used  . Tobacco comment: 2-3 a week at most    Substance and Sexual Activity  . Alcohol use: Yes    Comment: occasionally  . Drug use: No  . Sexual activity: Not on file    Allergies  Allergen Reactions  . Erythromycin Anaphylaxis, Shortness Of Breath and Swelling     Outpatient Medications Prior to Visit  Medication Sig Dispense Refill  . ADVAIR DISKUS 500-50 MCG/DOSE AEPB Inhale 1 puff into the lungs 2 (two) times daily. 60 each 12  . albuterol (PROVENTIL) (2.5 MG/3ML) 0.083% nebulizer solution Take 2.5 mg by nebulization every 6 (six) hours as needed for wheezing.     Marland Kitchen ALPRAZolam (XANAX) 0.5 MG tablet Take 0.5 mg by mouth at bedtime.   5  . ascorbic acid (VITAMIN C) 1000 MG tablet Take 1,000 mg by mouth daily.    . B Complex-C (SUPER B COMPLEX PO) Take 1 tablet by mouth daily.    . Cetirizine-Pseudoephedrine (ZYRTEC-D PO) Take 1 tablet by mouth daily.    . cyclobenzaprine (FLEXERIL) 10 MG tablet Take 10 mg by mouth at bedtime.  3  . fluticasone (FLONASE) 50 MCG/ACT nasal spray USE TWO SPRAYS IN EACH NOSTRIL EVERY MORNING.    Marland Kitchen levothyroxine (SYNTHROID) 100 MCG tablet Take 1 tablet by mouth daily.    . metFORMIN (GLUCOPHAGE-XR) 500 MG 24 hr tablet Take 500 mg by mouth 2 (two) times daily.  3  . montelukast (SINGULAIR) 10 MG tablet Take 10 mg by mouth every morning.  3  . sertraline (ZOLOFT) 100 MG tablet Take 100 mg by mouth daily.  5  . SUMAtriptan (IMITREX) 50 MG tablet TAKE ONE TABLET BY MOUTH DAILY FOR ACUTE MIGRAINE.    . VENTOLIN HFA 108 (90 BASE) MCG/ACT inhaler INHALE TWO PUFFS EVERY 4 TO 6 HOURS AS NEEDED FOR COUGH OR WHEEZE  12   No facility-administered medications prior to visit.    Review of Systems  Constitutional: Negative for chills, diaphoresis, fever, malaise/fatigue and weight loss.  HENT: Negative for congestion.   Respiratory: Positive for cough, shortness of breath and wheezing. Negative for hemoptysis and sputum production.   Cardiovascular: Negative for chest pain, palpitations and leg swelling.      Objective:   There were no vitals filed for this visit.   Physical Exam: General: Well-appearing, no acute distress HENT: [place], AT, OP clear, MMM Eyes: EOMI, no scleral icterus Respiratory: No audible respiratory distress or wheezing Neuro: AAO x4, CNII-XII grossly intact Skin: Intact, no rashes or bruising Psych: Normal mood, normal affect  Data Reviewed:  Imaging: None on file  PFT: None on file  Labs: OSH 02/01/2018 CBC and BMP  WBC 8.3 Eos 10% (absolute 800). BUN 7 creatinine 0.69  Imaging, labs and tests  noted above have been reviewed independently by me.    Assessment & Plan:   Discussion: 49 year old female with severe persistent eosinophilic asthma who presents with well-controlled asthma on current regimen. Previously had worsening symptoms requiring steroid and antibiotics last week that is now improved. Prior to exacerbation she was not adherent to therapy and currently working on taking medications as prescribed.  Severe persistent eosinophilic pneumonia --CONTINUE Advair 500/50 1 puff twice a day  --CONTINUE Albuterol 2 puffs as need for shortness of breath or wheezing --CONTINUE singulair --CONTINUE zyrtec --Scheduled pulmonary function tests on 3/9  Asthma Action Plan: For asthma exacerbations (coughing, wheezing shortness of breath), take Advair 2 puffs twice a day. Call our office or present to urgent care for persistent symptoms to evaluate need for prednisone  Grief Provided emotional support    Health Maintenance Immunization History  Administered Date(s) Administered  . Influenza Inj Mdck Quad Pf 01/04/2018  . Influenza,inj,Quad PF,6-35 Mos 01/16/2019   CT Lung Screen - Not qualified  No orders of the defined types were placed in this encounter.  No orders of the defined types were placed in this encounter.   Return after PFTs.  I have spent a total time of 36-minutes on the day of the appointment reviewing prior documentation,  coordinating care and discussing medical diagnosis and plan with the patient/family. Imaging, labs and tests included in this note have been reviewed and interpreted independently by me.   [place], MD [facility] 04/18/2019 4:29 PM  Office Number [phone]

## 2019-05-24 ENCOUNTER — Telehealth: Payer: Self-pay | Admitting: Pulmonary Disease

## 2019-05-24 NOTE — Telephone Encounter (Signed)
Called the patient back and she stated her employer requested letter since she will have to isolate until after she has been tested. The patient test scheduled for Friday 06/01/19, PFT to be done 06/05/19.  Letter will be sent to her by mychart access.  Letter completed. Nothing further needed at this time.

## 2019-06-01 ENCOUNTER — Other Ambulatory Visit (HOSPITAL_COMMUNITY): Payer: 59

## 2019-06-04 ENCOUNTER — Other Ambulatory Visit: Payer: Self-pay

## 2019-06-04 ENCOUNTER — Other Ambulatory Visit (HOSPITAL_COMMUNITY)
Admission: RE | Admit: 2019-06-04 | Discharge: 2019-06-04 | Disposition: A | Discharge status: Home / Self Care | Payer: 59 | Source: Ambulatory Visit | Attending: Pulmonary Disease | Admitting: Pulmonary Disease

## 2019-06-04 DIAGNOSIS — Z20822 Contact with and (suspected) exposure to covid-19: Secondary | ICD-10-CM | POA: Diagnosis not present

## 2019-06-04 DIAGNOSIS — Z01812 Encounter for preprocedural laboratory examination: Secondary | ICD-10-CM | POA: Diagnosis present

## 2019-06-05 ENCOUNTER — Encounter: Payer: Self-pay | Admitting: Pulmonary Disease

## 2019-06-05 ENCOUNTER — Other Ambulatory Visit: Payer: Self-pay

## 2019-06-05 ENCOUNTER — Ambulatory Visit (INDEPENDENT_AMBULATORY_CARE_PROVIDER_SITE_OTHER): Payer: 59 | Admitting: Pulmonary Disease

## 2019-06-05 VITALS — BP 112/72 | HR 87 | Temp 97.5°F | Ht 65.0 in | Wt 366.0 lb

## 2019-06-05 DIAGNOSIS — J4550 Severe persistent asthma, uncomplicated: Secondary | ICD-10-CM | POA: Diagnosis not present

## 2019-06-05 LAB — PULMONARY FUNCTION TEST
DL/VA % pred: 104 %
DL/VA: 4.49 ml/min/mmHg/L
DLCO cor % pred: 111 %
DLCO cor: 24.49 ml/min/mmHg
DLCO unc % pred: 111 %
DLCO unc: 24.49 ml/min/mmHg
FEF 25-75 Post: 2.2 L/sec
FEF 25-75 Pre: 1.83 L/sec
FEF2575-%Change-Post: 20 %
FEF2575-%Pred-Post: 75 %
FEF2575-%Pred-Pre: 63 %
FEV1-%Change-Post: 4 %
FEV1-%Pred-Post: 91 %
FEV1-%Pred-Pre: 87 %
FEV1-Post: 2.69 L
FEV1-Pre: 2.58 L
FEV1FVC-%Change-Post: 3 %
FEV1FVC-%Pred-Pre: 91 %
FEV6-%Change-Post: 1 %
FEV6-%Pred-Post: 97 %
FEV6-%Pred-Pre: 96 %
FEV6-Post: 3.53 L
FEV6-Pre: 3.47 L
FEV6FVC-%Change-Post: 0 %
FEV6FVC-%Pred-Post: 102 %
FEV6FVC-%Pred-Pre: 101 %
FVC-%Change-Post: 0 %
FVC-%Pred-Post: 95 %
FVC-%Pred-Pre: 94 %
FVC-Post: 3.54 L
FVC-Pre: 3.51 L
Post FEV1/FVC ratio: 76 %
Post FEV6/FVC ratio: 100 %
Pre FEV1/FVC ratio: 74 %
Pre FEV6/FVC Ratio: 99 %
RV % pred: 130 %
RV: 2.34 L
TLC % pred: 118 %
TLC: 6.17 L

## 2019-06-05 LAB — CBC WITH DIFFERENTIAL/PLATELET
Basophils Absolute: 0 10*3/uL (ref 0.0–0.1)
Basophils Relative: 0.2 % (ref 0.0–3.0)
Eosinophils Absolute: 0.5 10*3/uL (ref 0.0–0.7)
Eosinophils Relative: 5.7 % — ABNORMAL HIGH (ref 0.0–5.0)
HCT: 37.1 % (ref 36.0–46.0)
Hemoglobin: 12 g/dL (ref 12.0–15.0)
Lymphocytes Relative: 30.1 % (ref 12.0–46.0)
Lymphs Abs: 2.6 10*3/uL (ref 0.7–4.0)
MCHC: 32.2 g/dL (ref 30.0–36.0)
MCV: 83.9 fl (ref 78.0–100.0)
Monocytes Absolute: 0.6 10*3/uL (ref 0.1–1.0)
Monocytes Relative: 7.5 % (ref 3.0–12.0)
Neutro Abs: 4.9 10*3/uL (ref 1.4–7.7)
Neutrophils Relative %: 56.5 % (ref 43.0–77.0)
Platelets: 204 10*3/uL (ref 150.0–400.0)
RBC: 4.42 Mil/uL (ref 3.87–5.11)
RDW: 14 % (ref 11.5–15.5)
WBC: 8.7 10*3/uL (ref 4.0–10.5)

## 2019-06-05 LAB — SARS CORONAVIRUS 2 (TAT 6-24 HRS): SARS Coronavirus 2: NEGATIVE

## 2019-06-05 NOTE — Progress Notes (Signed)
Subjective:   PATIENT ID: Connie Simpson GENDER: female DOB: 1970-06-18, MRN: [id number]   HPI  Chief Complaint  Patient presents with  . Follow-up    pft, increased shortness of breath and congestion    Reason for Visit: Follow-up  Ms. Connie Simpson is a 49 year old female with hx of asthma, hx of sinus surgery for nasal polyps, allergic rhinitis who presents for asthma management.   Of note, she was diagnosed with COVID in December along with co-workers, daughter and co-workers' family members. She has had recently been stressed as her brother passed away from pneumonia in 04-25-22. With her recent infection and her brother's passing, she would like to make sure she optimizes her respiratory health.Since her diagnosis of COVID, she reports less energy and activity. She also feels like her depression is part of this as well. There are multiple family members who have passed from [place].   Interval Since our last visit, she has been compliant with Advair. Overall her asthma is well controlled. She has been more active so in the last week she will take 2 puffs twice a day 1-2 days a week. Denies nocturnal symptoms. Usually triggered by illness or allergies. No wheezing or cough unless sick. Has some congestion.   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  Past Medical History:  Diagnosis Date  . Anxiety   . Arthritis    knees bilateral  . Asthma   . Depression   . Headache    occasional  . History of bronchitis   . History of pneumonia   . Hypothyroidism   . PCOS (polycystic ovarian syndrome)   . PCOS  (polycystic ovarian syndrome)   . Pneumonia   . Restless leg   . Thyroid disease      Family History  Problem Relation Age of Onset  . Diabetes Mother   . Hypertension Mother   . Heart disease Father   . Hypertension Father   . Asthma Brother   . Allergies Brother   . Thyroid disease Sister      Social History   Occupational History  . Not on file  Tobacco Use  . Smoking status: Former Smoker    Packs/day: 0.05    Years: 3.00    Pack years: 0.15    Types: Cigarettes    Start date: 29    Quit date: 11/14/1990    Years since quitting: 28.5  . Smokeless tobacco: Never Used  . Tobacco comment: 2-3 a week at most  Substance and Sexual Activity  . Alcohol use: Yes    Comment: occasionally  . Drug use: No  . Sexual activity: Not on file    Allergies  Allergen Reactions  . Erythromycin Anaphylaxis, Shortness Of Breath and Swelling     Outpatient Medications Prior to Visit  Medication Sig Dispense Refill  . ADVAIR DISKUS 500-50 MCG/DOSE AEPB Inhale 1 puff into the lungs 2 (two) times daily. 60 each 12  . albuterol (PROVENTIL) (2.5  MG/3ML) 0.083% nebulizer solution Take 2.5 mg by nebulization every 6 (six) hours as needed for wheezing.     Marland Kitchen ALPRAZolam (XANAX) 0.5 MG tablet Take 0.5 mg by mouth at bedtime.   5  . ascorbic acid (VITAMIN C) 1000 MG tablet Take 1,000 mg by mouth daily.    . B Complex-C (SUPER B COMPLEX PO) Take 1 tablet by mouth daily.    . Cetirizine-Pseudoephedrine (ZYRTEC-D PO) Take 1 tablet by mouth daily.    . cyclobenzaprine (FLEXERIL) 10 MG tablet Take 10 mg by mouth at bedtime.  3  . fluticasone (FLONASE) 50 MCG/ACT nasal spray USE TWO SPRAYS IN EACH NOSTRIL EVERY MORNING.    Marland Kitchen levothyroxine (SYNTHROID) 100 MCG tablet Take 1 tablet by mouth daily.    . metFORMIN (GLUCOPHAGE-XR) 500 MG 24 hr tablet Take 500 mg by mouth 2 (two) times daily.  3  . montelukast (SINGULAIR) 10 MG tablet Take 10 mg by mouth every morning.  3  . sertraline (ZOLOFT) 100 MG  tablet Take 150 mg by mouth daily.    . SUMAtriptan (IMITREX) 50 MG tablet TAKE ONE TABLET BY MOUTH DAILY FOR ACUTE MIGRAINE.    Marland Kitchen topiramate (TOPAMAX) 50 MG tablet Take 150 mg by mouth daily.     . VENTOLIN HFA 108 (90 BASE) MCG/ACT inhaler INHALE TWO PUFFS EVERY 4 TO 6 HOURS AS NEEDED FOR COUGH OR WHEEZE  12  . sertraline (ZOLOFT) 100 MG tablet Take 100 mg by mouth daily.  5   No facility-administered medications prior to visit.    Review of Systems  Constitutional: Negative for chills, diaphoresis, fever, malaise/fatigue and weight loss.  HENT: Negative for congestion.   Respiratory: Positive for shortness of breath. Negative for cough, hemoptysis, sputum production and wheezing.   Cardiovascular: Negative for chest pain, palpitations and leg swelling.     Objective:   Vitals:   06/05/19 1221  BP: 112/72  Pulse: 87  Temp: (!) 97.5 F (36.4 C)  TempSrc: Temporal  SpO2: 99%  Weight: (!) 366 lb (166 kg)  Height: 5\' 5"  (1.651 m)   SpO2: 99 % O2 Device: None (Room air)  Physical Exam: General: Obese, well-appearing, no acute distress HENT: [place], AT Eyes: EOMI, no scleral icterus Respiratory: Clear to auscultation bilaterally.  No crackles, wheezing or rales Cardiovascular: RRR, -M/R/G, no JVD Extremities:-Edema,-tenderness Neuro: AAO x4, CNII-XII grossly intact Psych: Normal mood, normal affect  Data Reviewed:  Imaging: None on file  PFT: 06/05/19 FVC 3.54 (95%) FEV1 2.69 (91%) Ratio 74  TLC 118% RV 130% DLCO 111% Interpretation: No evidence of obstructive lung disease or significant bronchodilator response. Increased lung volumes suggestive of air trapping and normal DLCO  Labs: OSH 02/01/2018 CBC and BMP  WBC 8.3 Eos 10% (absolute 800). BUN 7 creatinine 0.69  CBC 06/05/19 WBC 8.7 Eos 500 IgE 06/05/19 132  Imaging, labs and test noted above have been reviewed independently by me.  Assessment & Plan:   Discussion: 49 year old female with severe persistent  eosinophilic asthma who presents with generally well-controlled asthma on high dose ICS. Usually exacerbated by illness. Compliant on current regimen. We reviewed PFTs in-clinic together which suggests good control of her asthma on her current regimen. No indication to start biologics at this point.  Severe persistent eosinophilic pneumonia --CONTINUE Advair 500/50 1 puff twice a day  --CONTINUE Albuterol 2 puffs as need for shortness of breath or wheezing --CONTINUE singulair --CONTINUE zyrtec --Obtain CBC with diff and IgE (results noted above;  message sent on mychart)  Asthma Action Plan: For asthma exacerbations (coughing, wheezing shortness of breath), take Advair 2 puffs twice a day. Call our office or present to urgent care for persistent symptoms to evaluate need for prednisone   Health Maintenance Immunization History  Administered Date(s) Administered  . Influenza Inj Mdck Quad Pf 01/04/2018  . Influenza,inj,Quad PF,6-35 Mos 01/16/2019   CT Lung Screen - Not qualified  Orders Placed This Encounter  Procedures  . CBC with Differential/Platelet    Standing Status:   Future    Number of Occurrences:   1    Standing Expiration Date:   06/04/2020  . IgE    Standing Status:   Future    Number of Occurrences:   1    Standing Expiration Date:   06/04/2020   No orders of the defined types were placed in this encounter.   Return in about 6 months (around 12/06/2019).  I have spent a total time of 36-minutes on the day of the appointment reviewing prior documentation, coordinating care and discussing medical diagnosis and plan with the patient/family. Imaging, labs and tests included in this note have been reviewed and interpreted independently by me.  Val Schiavo Rodman Pickle, MD [facility] 06/05/2019 12:50 PM  Office Number [phone]

## 2019-06-05 NOTE — Patient Instructions (Addendum)
Severe persistent eosinophilic pneumonia --CONTINUE Advair 500/50 1 puff twice a day  --CONTINUE Albuterol 2 puffs as need for shortness of breath or wheezing --CONTINUE singulair --CONTINUE zyrtec --Obtain CBC with diff and IgE  Asthma Action Plan: For asthma exacerbations (coughing, wheezing shortness of breath), take Advair 2 puffs twice a day. Call our office or present to urgent care for persistent symptoms to evaluate need for prednisone  Follow-up in 6 months with me

## 2019-06-05 NOTE — Progress Notes (Signed)
PFT done today. 

## 2019-06-06 LAB — IGE: IgE (Immunoglobulin E), Serum: 132 kU/L — ABNORMAL HIGH (ref <=114)

## 2019-11-15 IMAGING — MR MR HEAD WO/W CM
19 series · 48 of 48 positions shown · IV contrast (multihance)
Comparison: Brain MRI report 02/13/2011.

CLINICAL DATA: Follow-up pituitary adenoma. Reportedly patient was
diagnosed with a pituitary adenoma 10 years ago but never followed
up.

EXAM:
MRI HEAD WITHOUT AND WITH CONTRAST
TECHNIQUE: Multiplanar, multiecho pulse sequences of the brain and surrounding
structures were obtained without and with intravenous contrast.
CONTRAST:  10mL MULTIHANCE GADOBENATE DIMEGLUMINE 529 MG/ML IV SOLN

[Series 5: T1 · sagittal · 4.0mm · 0.72mm/px · 3 of 29 slices shown (1 of 5)]
[im 1/29]
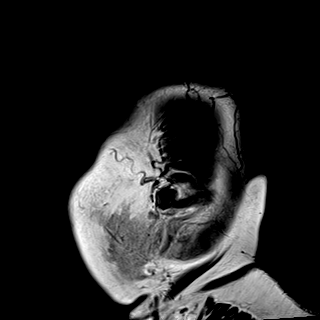
[im 15/29]
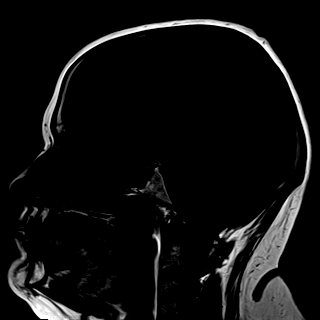
[im 29/29]
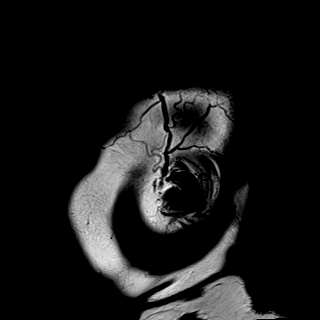

[Series 6: DWI · axial · 3.0mm · 1.44mm/px · z∈[-24,+132]mm · 8 of 84 slices shown (1 of 2)]
[im 1/84]
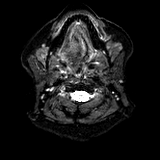
[im 12/84]
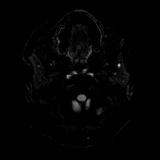
[im 24/84]
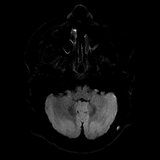
[im 36/84]
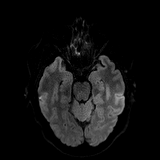
[im 48/84]
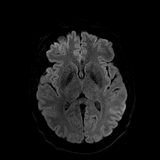
[im 60/84]
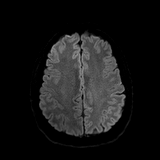
[im 72/84]
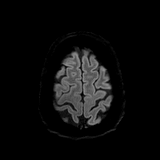
[im 84/84]
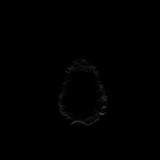

[Series 7: DWI · axial · 3.0mm · 1.44mm/px · z∈[-24,+132]mm · 3 of 41 slices shown (2 of 2)]
[im 1/41]
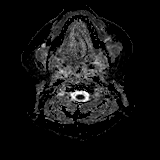
[im 21/41]
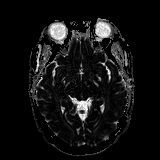
[im 41/41]
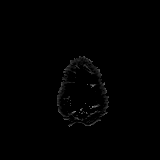

[Series 8: T2 · axial · 4.0mm · 0.36mm/px · z∈[-20,+127]mm · 2 of 30 slices shown]
[im 1/30]
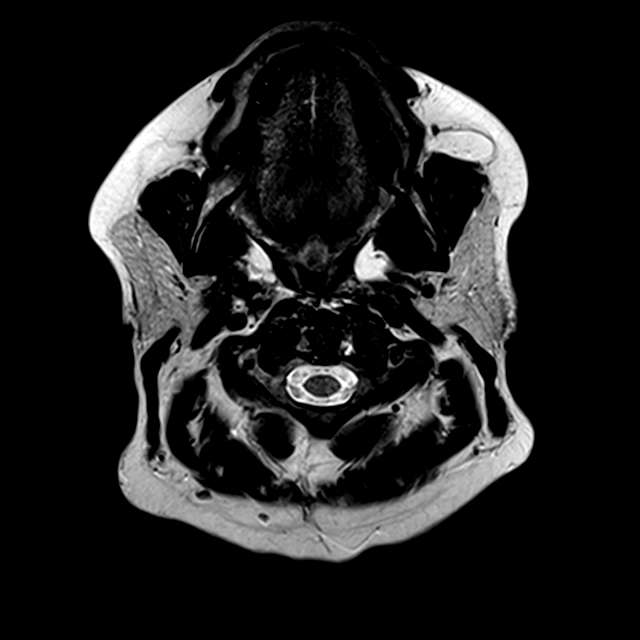
[im 30/30]
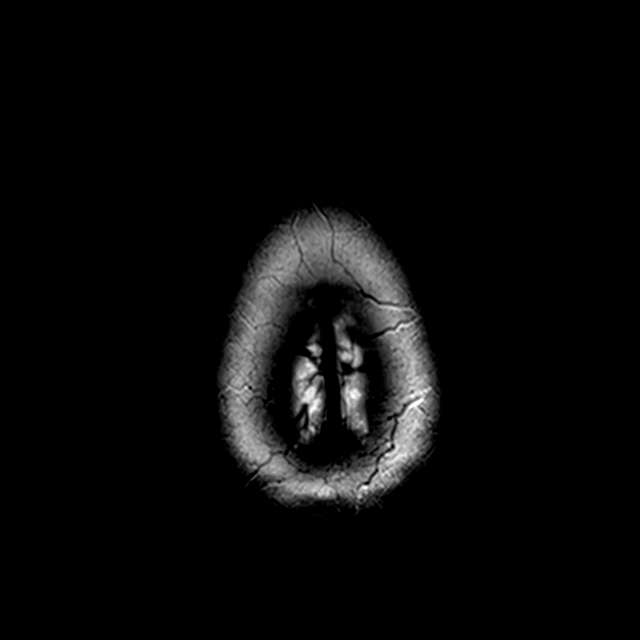

[Series 9: GRE · axial · 4.0mm · 0.69mm/px · z∈[-21,+126]mm · 2 of 30 slices shown]
[im 1/30]
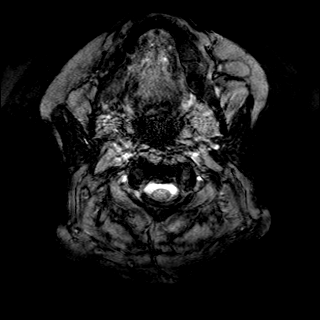
[im 30/30]
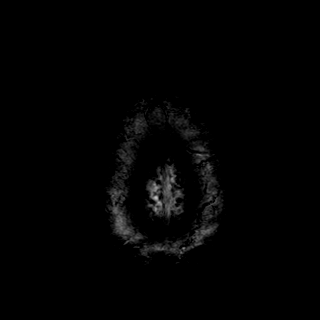

[Series 10: FLAIR · axial · 3.0mm · 0.72mm/px · z∈[-13,+121]mm · 2 of 24 slices shown]
[im 1/24]
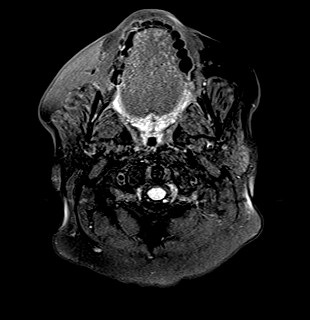
[im 24/24]
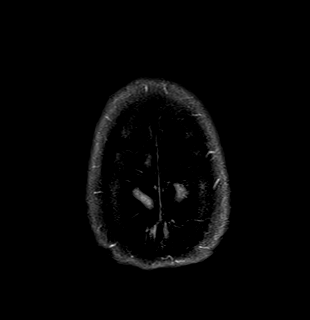

[Series 11: T1 · sagittal · 3.0mm · 0.42mm/px · 1 of 15 slices shown (2 of 5)]
[im 1/15]
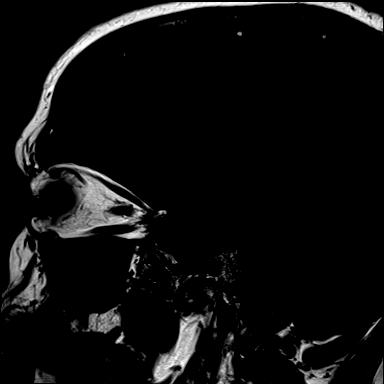

[Series 12: T1 · coronal · 3.0mm · 0.42mm/px · 1 of 13 slices shown (3 of 5)]
[im 1/13]
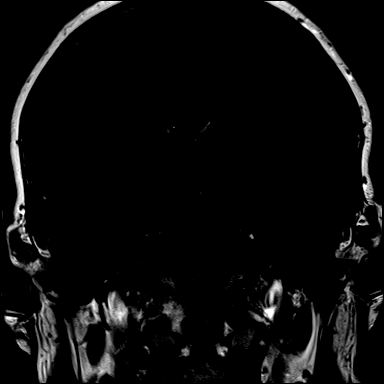

[Series 13: T1 · coronal · non-contrast · 3.0mm · 0.62mm/px · 1 of 13 slices shown (4 of 5)]
[im 1/13]
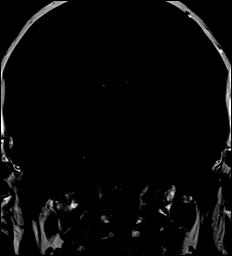

[Series 14: cor post dyn · coronal · 3.0mm · 0.62mm/px · 1 of 13 slices shown (1 of 6)]
[im 1/13]
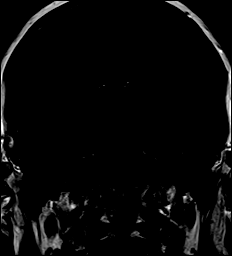

[Series 15: cor post dyn · coronal · 3.0mm · 0.62mm/px · 1 of 13 slices shown (2 of 6)]
[im 1/13]
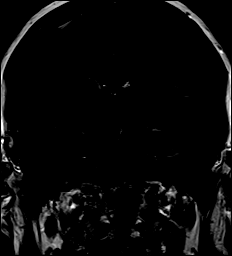

[Series 16: cor post dyn · coronal · 3.0mm · 0.62mm/px · 1 of 13 slices shown (3 of 6)]
[im 1/13]
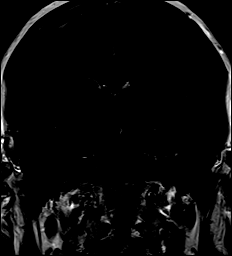

[Series 17: cor post dyn · coronal · 3.0mm · 0.62mm/px · 1 of 13 slices shown (4 of 6)]
[im 1/13]
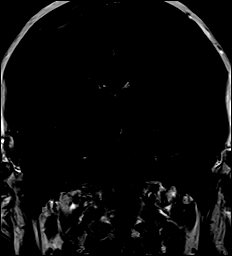

[Series 18: cor post dyn · coronal · 3.0mm · 0.62mm/px · 1 of 13 slices shown (5 of 6)]
[im 1/13]
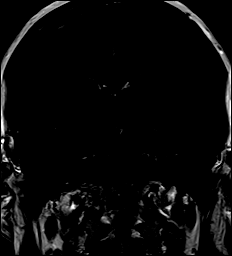

[Series 19: cor post dyn · coronal · 3.0mm · 0.62mm/px · 1 of 13 slices shown (6 of 6)]
[im 1/13]
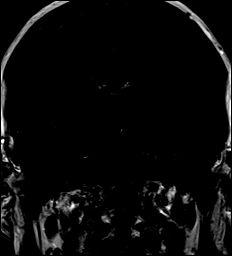

[Series 20: T1 post-contrast · sagittal · 3.0mm · 0.42mm/px · 1 of 15 slices shown (1 of 3)]
[im 1/15]
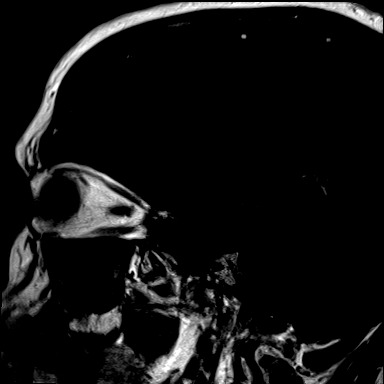

[Series 21: T1 post-contrast · coronal · 3.0mm · 0.42mm/px · 1 of 13 slices shown (2 of 3)]
[im 1/13]
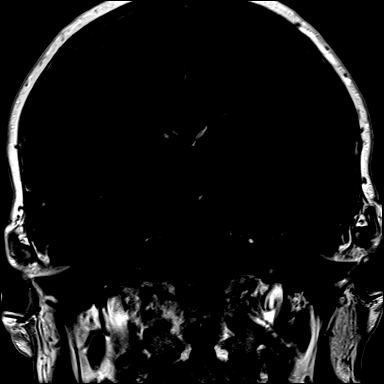

[Series 22: T1 · axial · 1.0mm · 0.86mm/px · z∈[-35,+137]mm · 14 of 176 slices shown (5 of 5)]
[im 1/176]
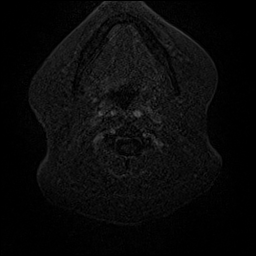
[im 14/176]
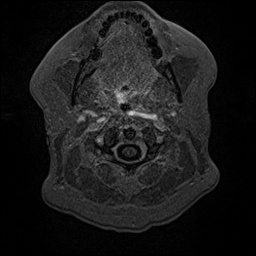
[im 27/176]
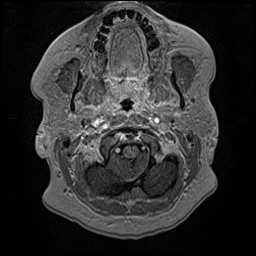
[im 41/176]
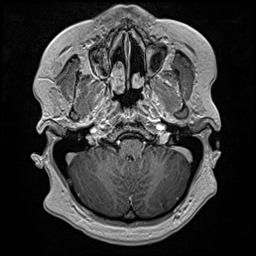
[im 54/176]
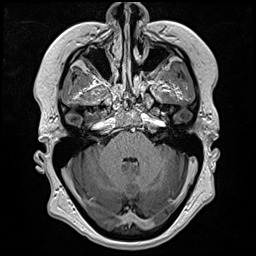
[im 68/176]
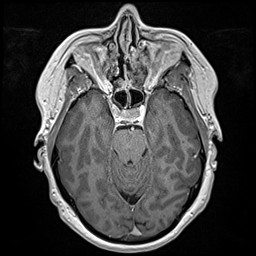
[im 81/176]
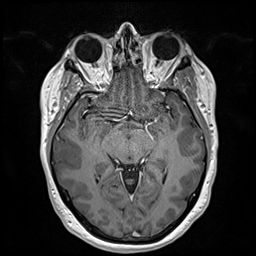
[im 95/176]
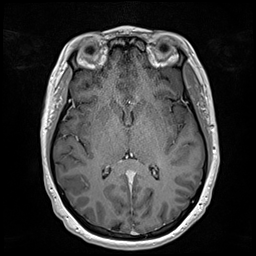
[im 108/176]
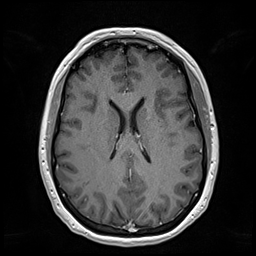
[im 122/176]
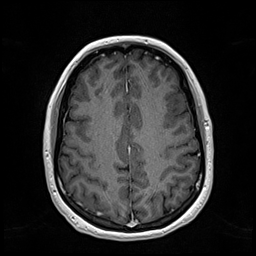
[im 135/176]
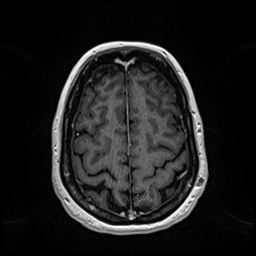
[im 149/176]
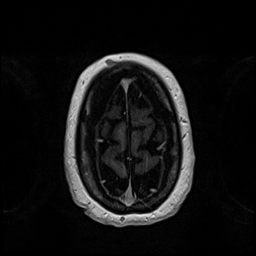
[im 162/176]
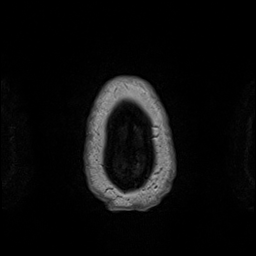
[im 176/176]
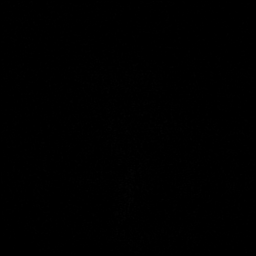

[Series 23: T1 post-contrast · coronal · 4.0mm · 0.47mm/px · 3 of 34 slices shown (3 of 3)]
[im 1/34]
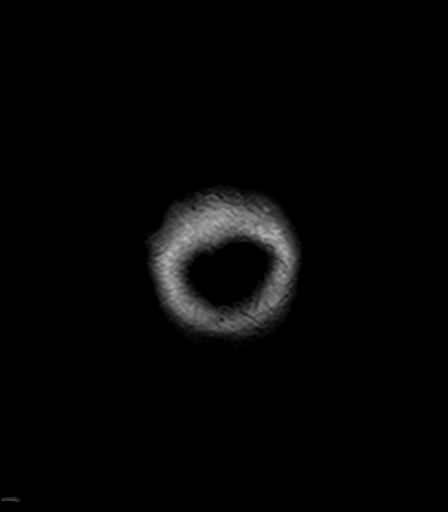
[im 17/34]
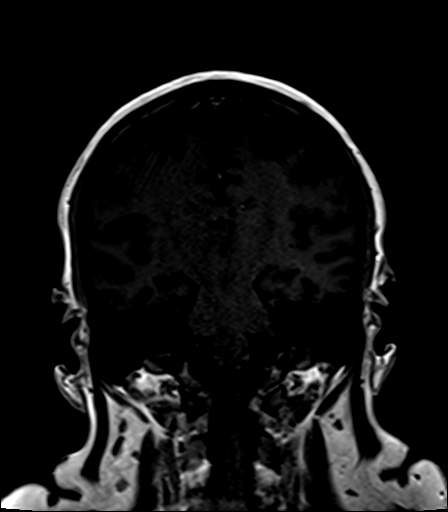
[im 34/34]
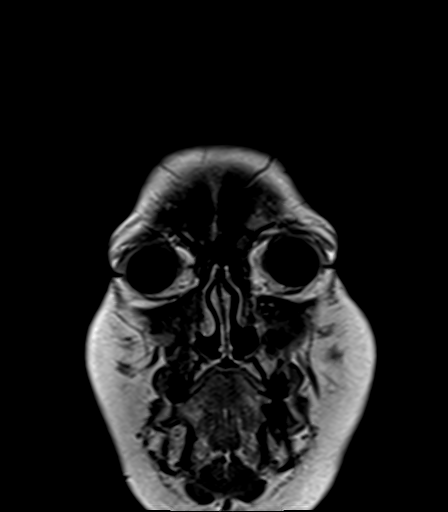

[48 of 48 positions shown; findings below may reference images not displayed]

FINDINGS: Brain: The left pituitary is Jarod than the right and on coronal
postcontrast imaging there is a 3 x 5 mm vague hypoenhancing area
which may reflect an adenoma. A discrete adenoma was not described
on previous reports, rather nonspecific gland enlargement. The
pituitary measures up to 9 mm craniocaudal today on subpleural
acquisition. No suprasellar extension or cavernous sinus invasion.
The infundibulum is normal.

The brain parenchyma has a normal appearance with no infarct,
hemorrhage, hydrocephalus, or mass. No white matter disease or
atrophy.

Vascular: Major flow voids and vascular enhancements are preserved.

Skull and upper cervical spine: Negative for marrow lesion

Sinuses/Orbits: Prior endoscopic paranasal sinus surgery. There is
mucosal thickening in the sphenoid sinuses with retention cyst in
the floor of the left maxillary sinus. Bilateral ethmoid mucosal
thickening.

Other: Small dermal inclusion cyst in the right frontal scalp.
IMPRESSION: 1. Mild fullness of the left pituitary gland without progression
when compared to MRI report in 7927. Suspected underlying 3 x 5 mm
microadenoma.
2. Normal appearance of the brain parenchyma.
3. Chronic bilateral sinusitis.  Prior endoscopic sinus surgery.

## 2020-10-06 ENCOUNTER — Other Ambulatory Visit: Payer: Self-pay

## 2020-10-06 ENCOUNTER — Ambulatory Visit: Payer: BC Managed Care – PPO | Admitting: Pulmonary Disease

## 2020-10-06 ENCOUNTER — Encounter: Payer: Self-pay | Admitting: Pulmonary Disease

## 2020-10-06 VITALS — BP 122/76 | HR 70 | Temp 97.9°F | Ht 65.0 in | Wt 344.4 lb

## 2020-10-06 DIAGNOSIS — J4550 Severe persistent asthma, uncomplicated: Secondary | ICD-10-CM

## 2020-10-06 NOTE — Patient Instructions (Addendum)
  Severe persistent eosinophilic pneumonia --CONTINUE Advair 500/50 1 puff twice a day  --CONTINUE Albuterol 2 puffs as need for shortness of breath or wheezing --CONTINUE singulair --CONTINUE zyrtec --Continue working on weight loss with regular aerobic exercise and diet  Asthma Action Plan: For asthma exacerbations (coughing, wheezing shortness of breath), take Advair 2 puffs twice a day. Call our office or present to urgent care for persistent symptoms to evaluate need for prednisone  Follow-up with me in 6 months

## 2020-10-06 NOTE — Progress Notes (Signed)
Subjective:   PATIENT ID: Connie Simpson GENDER: female DOB: Aug 11, 1970, MRN: [id number]   HPI  Chief Complaint  Patient presents with   Follow-up    Pt states she has been doing okay since last visit and denies any complaints.    Reason for Visit: Follow-up  Ms. Connie Simpson is a 50 year old female with hx of asthma, hx of sinus surgery for nasal polyps, allergic rhinitis, hx COVID-19 02/2019 who presents for asthma follow-up.   Since our last visit, she reports her asthma overall is well-controlled. She is compliant on high dose Advair. She is walking daily. Cut down on sodas and varying her diet with lots of vegetables and protein. She has lost 22lbs since our last visit together over a year ago. Her last exacerbation was a month ago requiring steroids but had not exacerbations before this. Denies shortness of breath, cough or wheezing. Does have shortness of breath with exertion. She rarely uses albuterol. 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 family members passed during pandemic  Environmental exposures: Lives in older home built in the 1930s   Past Medical History:  Diagnosis Date   Anxiety    Arthritis    knees bilateral   Asthma    Depression    Headache    occasional   History of bronchitis    History of pneumonia    Hypothyroidism    PCOS (polycystic ovarian syndrome)    PCOS (polycystic ovarian syndrome)    Pneumonia    Restless leg    Thyroid disease      Allergies  Allergen Reactions   Erythromycin Anaphylaxis, Shortness Of Breath and Swelling      Outpatient Medications Prior to Visit  Medication Sig Dispense Refill   ADVAIR DISKUS 500-50 MCG/DOSE AEPB Inhale 1 puff into the lungs 2 (two) times daily. 60 each 12   albuterol (PROVENTIL) (2.5 MG/3ML) 0.083% nebulizer solution Take 2.5 mg by nebulization every 6 (six) hours as needed for wheezing.      ALPRAZolam (XANAX) 0.5 MG tablet Take 0.5 mg by mouth at bedtime.  5   ascorbic acid (VITAMIN C) 1000 MG tablet Take 1,000 mg by mouth daily.     B Complex-C (SUPER B COMPLEX PO) Take 1 tablet by mouth daily.     Cetirizine-Pseudoephedrine (ZYRTEC-D PO) Take 1 tablet by mouth daily.     cyclobenzaprine (FLEXERIL) 10 MG tablet Take 10 mg by mouth at bedtime.  3   fluticasone (FLONASE) 50 MCG/ACT nasal spray USE TWO SPRAYS IN EACH NOSTRIL EVERY MORNING.     levothyroxine (SYNTHROID) 112 MCG tablet Take 112 mcg by mouth daily.     metFORMIN (GLUCOPHAGE-XR) 500 MG 24 hr tablet Take 500 mg by mouth daily with breakfast.  3   montelukast (SINGULAIR) 10 MG tablet Take 10 mg  by mouth every morning.  3   sertraline (ZOLOFT) 100 MG tablet Take 150 mg by mouth daily.     SUMAtriptan (IMITREX) 50 MG tablet TAKE ONE TABLET BY MOUTH DAILY FOR ACUTE MIGRAINE.     topiramate (TOPAMAX) 50 MG tablet Take by mouth.     VENTOLIN HFA 108 (90 BASE) MCG/ACT inhaler INHALE TWO PUFFS EVERY 4 TO 6 HOURS AS NEEDED FOR COUGH OR WHEEZE  12   levothyroxine (SYNTHROID) 100 MCG tablet Take 1 tablet by mouth daily.     topiramate (TOPAMAX) 50 MG tablet Take 150 mg by mouth daily.      No facility-administered medications prior to visit.    Review of Systems  Constitutional:  Negative for chills, diaphoresis, fever, malaise/fatigue and weight loss.  HENT:  Negative for congestion.   Respiratory:  Positive for shortness of breath. Negative for cough, hemoptysis, sputum production and wheezing.   Cardiovascular:  Negative for chest pain, palpitations and leg swelling.    Objective:   Vitals:   10/06/20 1340   BP: 122/76  Pulse: 70  Temp: 97.9 F (36.6 C)  TempSrc: Oral  SpO2: 99%  Weight: (!) 344 lb 6.4 oz (156.2 kg)  Height: 5\' 5"  (1.651 m)      Physical Exam: General: Well-appearing, no acute distress HENT: [place], AT, Eyes: EOMI, no scleral icterus Respiratory: Clear to auscultation bilaterally.  No crackles, wheezing or rales Cardiovascular: RRR, -M/R/G, no JVD Extremities:-Edema,-tenderness Neuro: AAO x4, CNII-XII grossly intact Psych: Normal mood, normal affect  Data Reviewed:  Imaging: CXR 10/02/14 - No infiltrate effusion or edema  PFT: 06/05/19 FVC 3.54 (95%) FEV1 2.69 (91%) Ratio 74  TLC 118% RV 130% DLCO 111% Interpretation: No evidence of obstructive lung disease or significant bronchodilator response. Increased lung volumes suggestive of air trapping and normal DLCO  Labs: OSH 02/01/2018 CBC and BMP  WBC 8.3 Eos 10% (absolute 800). BUN 7 creatinine 0.69  CBC 06/05/19 WBC 8.7 Eos 500 IgE 06/05/19 132   Assessment & Plan:   Discussion: 50 year old female with severe persistent eosinophilic asthma who presents for follow-up. Compliant and stable on her current regimen. We discussed clinical course and management of asthma. Discussed asthma action plan  Severe persistent eosinophilic pneumonia --CONTINUE Advair 500/50 1 puff twice a day  --CONTINUE Albuterol 2 puffs as need for shortness of breath or wheezing --CONTINUE singulair --CONTINUE zyrtec --Continue working on weight loss with regular aerobic exercise and diet  Asthma Action Plan:  For asthma exacerbations (coughing, wheezing shortness of breath), take Advair 2 puffs twice a day. Call our office or present to urgent care for persistent symptoms to evaluate need for prednisone   Health Maintenance Immunization History  Administered Date(s) Administered   Influenza Inj Mdck Quad Pf 01/04/2018   Influenza,inj,Quad PF,6-35 Mos 01/16/2019   Influenza-Unspecified 01/04/2018, 01/22/2020   CT Lung Screen - Not  qualified  No orders of the defined types were placed in this encounter.  No orders of the defined types were placed in this encounter.   Return in about 6 months (around 04/08/2021).  I have spent a total time of 32-minutes on the day of the appointment reviewing prior documentation, coordinating care and discussing medical diagnosis and plan with the patient/family. Past medical history, allergies, medications were reviewed. Pertinent imaging, labs and tests included in this note have been reviewed and interpreted independently by me.   [place], MD [facility] 10/06/2020 1:39 PM  Office Number [phone]

## 2022-06-17 DIAGNOSIS — R739 Hyperglycemia, unspecified: Secondary | ICD-10-CM | POA: Diagnosis not present

## 2022-06-17 DIAGNOSIS — F329 Major depressive disorder, single episode, unspecified: Secondary | ICD-10-CM | POA: Diagnosis not present

## 2022-06-17 DIAGNOSIS — F411 Generalized anxiety disorder: Secondary | ICD-10-CM | POA: Diagnosis not present

## 2022-06-17 DIAGNOSIS — E039 Hypothyroidism, unspecified: Secondary | ICD-10-CM | POA: Diagnosis not present

## 2022-06-17 DIAGNOSIS — G43919 Migraine, unspecified, intractable, without status migrainosus: Secondary | ICD-10-CM | POA: Diagnosis not present

## 2022-06-17 DIAGNOSIS — E119 Type 2 diabetes mellitus without complications: Secondary | ICD-10-CM | POA: Diagnosis not present

## 2022-06-17 DIAGNOSIS — J4541 Moderate persistent asthma with (acute) exacerbation: Secondary | ICD-10-CM | POA: Diagnosis not present

## 2022-06-17 DIAGNOSIS — R030 Elevated blood-pressure reading, without diagnosis of hypertension: Secondary | ICD-10-CM | POA: Diagnosis not present

## 2022-06-17 DIAGNOSIS — Z6841 Body Mass Index (BMI) 40.0 and over, adult: Secondary | ICD-10-CM | POA: Diagnosis not present

## 2022-06-29 DIAGNOSIS — F419 Anxiety disorder, unspecified: Secondary | ICD-10-CM | POA: Diagnosis not present

## 2022-06-29 DIAGNOSIS — F329 Major depressive disorder, single episode, unspecified: Secondary | ICD-10-CM | POA: Diagnosis not present

## 2022-07-16 DIAGNOSIS — Z6841 Body Mass Index (BMI) 40.0 and over, adult: Secondary | ICD-10-CM | POA: Diagnosis not present

## 2022-07-16 DIAGNOSIS — K219 Gastro-esophageal reflux disease without esophagitis: Secondary | ICD-10-CM | POA: Diagnosis not present

## 2022-07-16 DIAGNOSIS — G43919 Migraine, unspecified, intractable, without status migrainosus: Secondary | ICD-10-CM | POA: Diagnosis not present

## 2022-07-16 DIAGNOSIS — F419 Anxiety disorder, unspecified: Secondary | ICD-10-CM | POA: Diagnosis not present

## 2022-07-16 DIAGNOSIS — F32A Depression, unspecified: Secondary | ICD-10-CM | POA: Diagnosis not present

## 2022-07-16 DIAGNOSIS — J302 Other seasonal allergic rhinitis: Secondary | ICD-10-CM | POA: Diagnosis not present

## 2022-07-16 DIAGNOSIS — E039 Hypothyroidism, unspecified: Secondary | ICD-10-CM | POA: Diagnosis not present

## 2022-07-16 DIAGNOSIS — R030 Elevated blood-pressure reading, without diagnosis of hypertension: Secondary | ICD-10-CM | POA: Diagnosis not present

## 2022-07-16 DIAGNOSIS — J4541 Moderate persistent asthma with (acute) exacerbation: Secondary | ICD-10-CM | POA: Diagnosis not present

## 2022-08-05 DIAGNOSIS — F419 Anxiety disorder, unspecified: Secondary | ICD-10-CM | POA: Diagnosis not present

## 2022-08-05 DIAGNOSIS — F32A Depression, unspecified: Secondary | ICD-10-CM | POA: Diagnosis not present

## 2022-09-01 DIAGNOSIS — F419 Anxiety disorder, unspecified: Secondary | ICD-10-CM | POA: Diagnosis not present

## 2022-09-01 DIAGNOSIS — J302 Other seasonal allergic rhinitis: Secondary | ICD-10-CM | POA: Diagnosis not present

## 2022-09-01 DIAGNOSIS — Z6841 Body Mass Index (BMI) 40.0 and over, adult: Secondary | ICD-10-CM | POA: Diagnosis not present

## 2022-09-01 DIAGNOSIS — E039 Hypothyroidism, unspecified: Secondary | ICD-10-CM | POA: Diagnosis not present

## 2022-09-01 DIAGNOSIS — K219 Gastro-esophageal reflux disease without esophagitis: Secondary | ICD-10-CM | POA: Diagnosis not present

## 2022-09-01 DIAGNOSIS — R030 Elevated blood-pressure reading, without diagnosis of hypertension: Secondary | ICD-10-CM | POA: Diagnosis not present

## 2022-09-01 DIAGNOSIS — J4541 Moderate persistent asthma with (acute) exacerbation: Secondary | ICD-10-CM | POA: Diagnosis not present

## 2022-09-01 DIAGNOSIS — F32A Depression, unspecified: Secondary | ICD-10-CM | POA: Diagnosis not present

## 2022-09-01 DIAGNOSIS — G43919 Migraine, unspecified, intractable, without status migrainosus: Secondary | ICD-10-CM | POA: Diagnosis not present

## 2022-09-07 DIAGNOSIS — F419 Anxiety disorder, unspecified: Secondary | ICD-10-CM | POA: Diagnosis not present

## 2022-09-07 DIAGNOSIS — F32A Depression, unspecified: Secondary | ICD-10-CM | POA: Diagnosis not present

## 2022-10-06 DIAGNOSIS — F419 Anxiety disorder, unspecified: Secondary | ICD-10-CM | POA: Diagnosis not present

## 2022-10-06 DIAGNOSIS — F32A Depression, unspecified: Secondary | ICD-10-CM | POA: Diagnosis not present

## 2022-11-01 DIAGNOSIS — R5383 Other fatigue: Secondary | ICD-10-CM | POA: Diagnosis not present

## 2022-11-01 DIAGNOSIS — E119 Type 2 diabetes mellitus without complications: Secondary | ICD-10-CM | POA: Diagnosis not present

## 2022-11-01 DIAGNOSIS — Z13220 Encounter for screening for lipoid disorders: Secondary | ICD-10-CM | POA: Diagnosis not present

## 2022-11-01 DIAGNOSIS — E039 Hypothyroidism, unspecified: Secondary | ICD-10-CM | POA: Diagnosis not present

## 2022-11-01 DIAGNOSIS — R739 Hyperglycemia, unspecified: Secondary | ICD-10-CM | POA: Diagnosis not present

## 2022-11-02 DIAGNOSIS — E119 Type 2 diabetes mellitus without complications: Secondary | ICD-10-CM | POA: Diagnosis not present

## 2022-11-02 DIAGNOSIS — K219 Gastro-esophageal reflux disease without esophagitis: Secondary | ICD-10-CM | POA: Diagnosis not present

## 2022-11-02 DIAGNOSIS — Z13220 Encounter for screening for lipoid disorders: Secondary | ICD-10-CM | POA: Diagnosis not present

## 2022-11-03 DIAGNOSIS — F32A Depression, unspecified: Secondary | ICD-10-CM | POA: Diagnosis not present

## 2022-11-03 DIAGNOSIS — G43919 Migraine, unspecified, intractable, without status migrainosus: Secondary | ICD-10-CM | POA: Diagnosis not present

## 2022-11-03 DIAGNOSIS — F419 Anxiety disorder, unspecified: Secondary | ICD-10-CM | POA: Diagnosis not present

## 2022-11-03 DIAGNOSIS — J4541 Moderate persistent asthma with (acute) exacerbation: Secondary | ICD-10-CM | POA: Diagnosis not present

## 2022-11-03 DIAGNOSIS — Z6841 Body Mass Index (BMI) 40.0 and over, adult: Secondary | ICD-10-CM | POA: Diagnosis not present

## 2022-11-03 DIAGNOSIS — J302 Other seasonal allergic rhinitis: Secondary | ICD-10-CM | POA: Diagnosis not present

## 2022-11-03 DIAGNOSIS — K219 Gastro-esophageal reflux disease without esophagitis: Secondary | ICD-10-CM | POA: Diagnosis not present

## 2022-11-03 DIAGNOSIS — R030 Elevated blood-pressure reading, without diagnosis of hypertension: Secondary | ICD-10-CM | POA: Diagnosis not present

## 2022-11-03 DIAGNOSIS — E039 Hypothyroidism, unspecified: Secondary | ICD-10-CM | POA: Diagnosis not present

## 2022-11-12 DIAGNOSIS — F419 Anxiety disorder, unspecified: Secondary | ICD-10-CM | POA: Diagnosis not present

## 2022-11-12 DIAGNOSIS — F329 Major depressive disorder, single episode, unspecified: Secondary | ICD-10-CM | POA: Diagnosis not present

## 2022-12-01 DIAGNOSIS — F32A Depression, unspecified: Secondary | ICD-10-CM | POA: Diagnosis not present

## 2022-12-01 DIAGNOSIS — F419 Anxiety disorder, unspecified: Secondary | ICD-10-CM | POA: Diagnosis not present

## 2023-01-03 DIAGNOSIS — F419 Anxiety disorder, unspecified: Secondary | ICD-10-CM | POA: Diagnosis not present

## 2023-01-03 DIAGNOSIS — F32A Depression, unspecified: Secondary | ICD-10-CM | POA: Diagnosis not present

## 2023-01-20 DIAGNOSIS — Z23 Encounter for immunization: Secondary | ICD-10-CM | POA: Diagnosis not present

## 2023-01-20 DIAGNOSIS — E039 Hypothyroidism, unspecified: Secondary | ICD-10-CM | POA: Diagnosis not present

## 2023-01-20 DIAGNOSIS — F32A Depression, unspecified: Secondary | ICD-10-CM | POA: Diagnosis not present

## 2023-01-20 DIAGNOSIS — J4541 Moderate persistent asthma with (acute) exacerbation: Secondary | ICD-10-CM | POA: Diagnosis not present

## 2023-01-20 DIAGNOSIS — R030 Elevated blood-pressure reading, without diagnosis of hypertension: Secondary | ICD-10-CM | POA: Diagnosis not present

## 2023-01-20 DIAGNOSIS — F419 Anxiety disorder, unspecified: Secondary | ICD-10-CM | POA: Diagnosis not present

## 2023-01-20 DIAGNOSIS — G43919 Migraine, unspecified, intractable, without status migrainosus: Secondary | ICD-10-CM | POA: Diagnosis not present

## 2023-01-20 DIAGNOSIS — J302 Other seasonal allergic rhinitis: Secondary | ICD-10-CM | POA: Diagnosis not present

## 2023-01-20 DIAGNOSIS — K219 Gastro-esophageal reflux disease without esophagitis: Secondary | ICD-10-CM | POA: Diagnosis not present

## 2023-01-20 DIAGNOSIS — Z6841 Body Mass Index (BMI) 40.0 and over, adult: Secondary | ICD-10-CM | POA: Diagnosis not present

## 2023-08-07 DIAGNOSIS — Z6841 Body Mass Index (BMI) 40.0 and over, adult: Secondary | ICD-10-CM | POA: Diagnosis not present

## 2023-08-07 DIAGNOSIS — J029 Acute pharyngitis, unspecified: Secondary | ICD-10-CM | POA: Diagnosis not present

## 2023-08-07 DIAGNOSIS — R030 Elevated blood-pressure reading, without diagnosis of hypertension: Secondary | ICD-10-CM | POA: Diagnosis not present

## 2023-08-07 DIAGNOSIS — J019 Acute sinusitis, unspecified: Secondary | ICD-10-CM | POA: Diagnosis not present

## 2023-09-01 DIAGNOSIS — F411 Generalized anxiety disorder: Secondary | ICD-10-CM | POA: Diagnosis not present

## 2023-09-01 DIAGNOSIS — F330 Major depressive disorder, recurrent, mild: Secondary | ICD-10-CM | POA: Diagnosis not present

## 2023-10-08 DIAGNOSIS — F411 Generalized anxiety disorder: Secondary | ICD-10-CM | POA: Diagnosis not present

## 2023-10-08 DIAGNOSIS — F330 Major depressive disorder, recurrent, mild: Secondary | ICD-10-CM | POA: Diagnosis not present

## 2023-11-05 DIAGNOSIS — F411 Generalized anxiety disorder: Secondary | ICD-10-CM | POA: Diagnosis not present

## 2023-11-05 DIAGNOSIS — F330 Major depressive disorder, recurrent, mild: Secondary | ICD-10-CM | POA: Diagnosis not present

## 2023-12-03 DIAGNOSIS — F330 Major depressive disorder, recurrent, mild: Secondary | ICD-10-CM | POA: Diagnosis not present

## 2023-12-03 DIAGNOSIS — F411 Generalized anxiety disorder: Secondary | ICD-10-CM | POA: Diagnosis not present

## 2024-01-14 DIAGNOSIS — F411 Generalized anxiety disorder: Secondary | ICD-10-CM | POA: Diagnosis not present

## 2024-01-14 DIAGNOSIS — F330 Major depressive disorder, recurrent, mild: Secondary | ICD-10-CM | POA: Diagnosis not present

## 2024-01-28 DIAGNOSIS — F330 Major depressive disorder, recurrent, mild: Secondary | ICD-10-CM | POA: Diagnosis not present

## 2024-01-28 DIAGNOSIS — F411 Generalized anxiety disorder: Secondary | ICD-10-CM | POA: Diagnosis not present
